# Patient Record
Sex: Male | Born: 1995 | Race: Black or African American | Hispanic: No | Marital: Single | Smoking: Current every day smoker
Health system: Southern US, Community
[De-identification: ages and names within clinical notes are randomized; demographics above are authoritative.]

## PROBLEM LIST (undated history)

## (undated) DIAGNOSIS — Z789 Other specified health status: Secondary | ICD-10-CM

---
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
## 2016-08-23 LAB — HM HIV SCREENING LAB: HM HIV Screening: NEGATIVE

## 2017-10-01 ENCOUNTER — Emergency Department: Payer: Self-pay

## 2017-10-01 ENCOUNTER — Encounter: Payer: Self-pay | Admitting: Emergency Medicine

## 2017-10-01 ENCOUNTER — Other Ambulatory Visit: Payer: Self-pay

## 2017-10-01 ENCOUNTER — Emergency Department
Admission: EM | Admit: 2017-10-01 | Discharge: 2017-10-01 | Disposition: A | Discharge status: Home / Self Care | Payer: Self-pay | Attending: Emergency Medicine | Admitting: Emergency Medicine

## 2017-10-01 DIAGNOSIS — N200 Calculus of kidney: Secondary | ICD-10-CM | POA: Insufficient documentation

## 2017-10-01 DIAGNOSIS — F17200 Nicotine dependence, unspecified, uncomplicated: Secondary | ICD-10-CM | POA: Insufficient documentation

## 2017-10-01 DIAGNOSIS — R310 Gross hematuria: Secondary | ICD-10-CM

## 2017-10-01 LAB — COMPREHENSIVE METABOLIC PANEL
ALT: 18 U/L (ref 0–44)
AST: 22 U/L (ref 15–41)
Albumin: 4.6 g/dL (ref 3.5–5.0)
Alkaline Phosphatase: 43 U/L (ref 38–126)
Anion gap: 5 (ref 5–15)
BILIRUBIN TOTAL: 1 mg/dL (ref 0.3–1.2)
BUN: 10 mg/dL (ref 6–20)
CHLORIDE: 104 mmol/L (ref 98–111)
CO2: 31 mmol/L (ref 22–32)
CREATININE: 1.12 mg/dL (ref 0.61–1.24)
Calcium: 9.2 mg/dL (ref 8.9–10.3)
GFR calc Af Amer: >60 mL/min (ref >60)
Glucose, Bld: 88 mg/dL (ref 70–99)
Potassium: 4 mmol/L (ref 3.5–5.1)
Sodium: 140 mmol/L (ref 135–145)
Total Protein: 7.7 g/dL (ref 6.5–8.1)

## 2017-10-01 LAB — CBC WITH DIFFERENTIAL/PLATELET
Basophils Absolute: 0 10*3/uL (ref 0–0.1)
Basophils Relative: 1 %
Eosinophils Absolute: 0.1 10*3/uL (ref 0–0.7)
Eosinophils Relative: 1 %
HEMATOCRIT: 41.3 % (ref 40.0–52.0)
Hemoglobin: 14.3 g/dL (ref 13.0–18.0)
LYMPHS PCT: 45 %
Lymphs Abs: 2.7 10*3/uL (ref 1.0–3.6)
MCH: 30.9 pg (ref 26.0–34.0)
MCHC: 34.7 g/dL (ref 32.0–36.0)
MCV: 88.9 fL (ref 80.0–100.0)
Monocytes Absolute: 0.3 10*3/uL (ref 0.2–1.0)
Monocytes Relative: 5 %
NEUTROS ABS: 2.9 10*3/uL (ref 1.4–6.5)
Neutrophils Relative %: 48 %
Platelets: 185 10*3/uL (ref 150–440)
RBC: 4.65 MIL/uL (ref 4.40–5.90)
RDW: 13 % (ref 11.5–14.5)
WBC: 6 10*3/uL (ref 3.8–10.6)

## 2017-10-01 LAB — URINALYSIS, COMPLETE (UACMP) WITH MICROSCOPIC
Bacteria, UA: NONE SEEN
Bilirubin Urine: NEGATIVE
Glucose, UA: NEGATIVE mg/dL
Ketones, ur: NEGATIVE mg/dL
Leukocytes, UA: NEGATIVE
Nitrite: NEGATIVE
PROTEIN: 100 mg/dL — AB
RBC / HPF: >50 RBC/hpf — ABNORMAL HIGH (ref 0–5)
SQUAMOUS EPITHELIAL / LPF: NONE SEEN (ref 0–5)
Specific Gravity, Urine: 1.028 (ref 1.005–1.030)
pH: 6 (ref 5.0–8.0)

## 2017-10-01 LAB — CHLAMYDIA/NGC RT PCR (ARMC ONLY)
CHLAMYDIA TR: NOT DETECTED
N GONORRHOEAE: NOT DETECTED

## 2017-10-01 NOTE — ED Provider Notes (Signed)
[facility] Emergency Department Provider Note  ____________________________________________   First MD Initiated Contact with Patient 10/01/17 [phone]     (approximate)  I have reviewed the triage vital signs and the nursing notes.   HISTORY  Chief Complaint Hematuria   HPI Bruce Benson is a 22 y.o. male who self presents to the emergency department with 3 days of gross hematuria.  He does report some mild low back pain although nothing "too crazy".  He has no dysuria.  He says he has had difficulty initiating urination however he has been able to urinate.  No trauma.  No testicular pain.  No penile pain.  No discharge.  His symptoms came on suddenly are intermittent are moderate severity are worse whenever he urinates and improved when not.    History reviewed. No pertinent past medical history.  There are no active problems to display for this patient.   History reviewed. No pertinent surgical history.  Prior to Admission medications   Not on File    Allergies Patient has no known allergies.  No family history on file.  Social History Social History   Tobacco Use  . Smoking status: Current Every Day Smoker  . Smokeless tobacco: Never Used  Substance Use Topics  . Alcohol use: Not on file  . Drug use: Not on file    Review of Systems Constitutional: No fever/chills Eyes: No visual changes. ENT: No sore throat. Cardiovascular: Denies chest pain. Respiratory: Denies shortness of breath. Gastrointestinal: No abdominal pain.  No nausea, no vomiting.  No diarrhea.  No constipation. Genitourinary: Positive for hematuria Musculoskeletal: Positive for back pain. Skin: Negative for rash. Neurological: Negative for headaches, focal weakness or numbness.   ____________________________________________   PHYSICAL EXAM:  VITAL SIGNS: ED Triage Vitals  Enc Vitals Group     BP 10/01/17 0357 121/71     Pulse Rate 10/01/17 0357 (!) 47    Resp 10/01/17 0357 18     Temp 10/01/17 0357 97.9 F (36.6 C)     Temp Source 10/01/17 0357 Oral     SpO2 10/01/17 0357 99 %     Weight 10/01/17 0352 161 lb (73 kg)     Height 10/01/17 0352 6\' 3"  (1.905 m)     Head Circumference --      Peak Flow --      Pain Score 10/01/17 0352 0     Pain Loc --      Pain Edu? --      Excl. in GC? --     Constitutional: Alert and oriented x4 pleasant cooperative speaks in full clear sentences no diaphoresis Eyes: PERRL EOMI. Head: Atraumatic. Nose: No congestion/rhinnorhea. Mouth/Throat: No trismus Neck: No stridor.   Cardiovascular: Normal rate, regular rhythm. Grossly normal heart sounds.  Good peripheral circulation. Respiratory: Normal respiratory effort.  No retractions. Lungs CTAB and moving good air Gastrointestinal: Soft nontender Circumcised phallus normal testes normal lie no discharge Musculoskeletal: No lower extremity edema   Neurologic:  Normal speech and language. No gross focal neurologic deficits are appreciated. Skin:  Skin is warm, dry and intact. No rash noted. Psychiatric: Mood and affect are normal. Speech and behavior are normal.    ____________________________________________   DIFFERENTIAL includes but not limited to  Kidney stone, testicular hematoma, sexually transmitted infection, bladder cancer ____________________________________________   LABS (all labs ordered are listed, but only abnormal results are displayed)  Labs Reviewed  URINALYSIS, COMPLETE (UACMP) WITH MICROSCOPIC - Abnormal; Notable for the  following components:      Result Value   Color, Urine YELLOW (*)    APPearance CLOUDY (*)    Hgb urine dipstick LARGE (*)    Protein, ur 100 (*)    RBC / HPF >50 (*)    All other components within normal limits  CHLAMYDIA/NGC RT PCR ([facility] ONLY)  CBC WITH DIFFERENTIAL/PLATELET  COMPREHENSIVE METABOLIC PANEL    Lab work reviewed by me with gross hematuria otherwise  unremarkable __________________________________________  EKG   ____________________________________________  RADIOLOGY  CT stone reviewed by me consistent with recently passed stone ____________________________________________   PROCEDURES  Procedure(s) performed: no  Procedures  Critical Care performed: no  ____________________________________________   INITIAL IMPRESSION / ASSESSMENT AND PLAN / ED COURSE  Pertinent labs & imaging results that were available during my care of the patient were reviewed by me and considered in my medical decision making (see chart for details).   As part of my medical decision making, I reviewed the following data within the electronic MEDICAL RECORD NUMBER History obtained from family if available, nursing notes, old chart and ekg, as well as notes from prior ED visits.  The patient arrives hemodynamically stable and relatively well-appearing with new gross hematuria.  He did have some mild back pain and this could represent a new onset kidney stone.  CT stone pending.  No infectious symptoms.  The patient CT is consistent with a stone recently passed into his bladder.  At this point the patient is given reassurance and medically stable for outpatient management.      ____________________________________________   FINAL CLINICAL IMPRESSION(S) / ED DIAGNOSES  Final diagnoses:  Gross hematuria  Kidney stone      NEW MEDICATIONS STARTED DURING THIS VISIT:  There are no discharge medications for this patient.    Note:  This document was prepared using Dragon voice recognition software and may include unintentional dictation errors.     Merrily Brittleifenbark, Ihsan Nomura, MD 10/01/17 [phone]

## 2017-10-01 NOTE — ED Triage Notes (Addendum)
Patient ambulatory to triage with steady gait, without difficulty or distress noted; pt reports hematuria x 3 day but denies any c/o pain

## 2017-10-01 NOTE — ED Notes (Signed)
Patient reports he has been urinating blood for the past 3 days.  Patient also reports that when he urinates its a few drops at a time.  Denies pain with urination.   Reports occasional suprapubic pain.

## 2017-10-01 NOTE — ED Notes (Signed)
Returned from CT.

## 2017-10-01 NOTE — Discharge Instructions (Signed)
It was a pleasure to take care of you today, and thank you for coming to our emergency department.  If you have any questions or concerns before leaving please ask the nurse to grab me and I'm more than happy to go through your aftercare instructions again.  If you were prescribed any opioid pain medication today such as Norco, Vicodin, Percocet, morphine, hydrocodone, or oxycodone please make sure you do not drive when you are taking this medication as it can alter your ability to drive safely.  If you have any concerns once you are home that you are not improving or are in fact getting worse before you can make it to your follow-up appointment, please do not hesitate to call 911 and come back for further evaluation.  Merrily BrittleNeil Layni Kreamer, MD  Results for orders placed or performed during the hospital encounter of 10/01/17  Chlamydia/NGC rt PCR ([facility] only)  Result Value Ref Range   Specimen source GC/Chlam URINE, RANDOM    Chlamydia Tr NOT DETECTED NOT DETECTED   N gonorrhoeae NOT DETECTED NOT DETECTED  CBC with Differential  Result Value Ref Range   WBC 6.0 3.8 - 10.6 K/uL   RBC 4.65 4.40 - 5.90 MIL/uL   Hemoglobin 14.3 13.0 - 18.0 g/dL   HCT 16.141.3 09.640.0 - 04.552.0 %   MCV 88.9 80.0 - 100.0 fL   MCH 30.9 26.0 - 34.0 pg   MCHC 34.7 32.0 - 36.0 g/dL   RDW 40.913.0 81.111.5 - 91.414.5 %   Platelets 185 150 - 440 K/uL   Neutrophils Relative % 48 %   Neutro Abs 2.9 1.4 - 6.5 K/uL   Lymphocytes Relative 45 %   Lymphs Abs 2.7 1.0 - 3.6 K/uL   Monocytes Relative 5 %   Monocytes Absolute 0.3 0.2 - 1.0 K/uL   Eosinophils Relative 1 %   Eosinophils Absolute 0.1 0 - 0.7 K/uL   Basophils Relative 1 %   Basophils Absolute 0.0 0 - 0.1 K/uL  Comprehensive metabolic panel  Result Value Ref Range   Sodium 140 135 - 145 mmol/L   Potassium 4.0 3.5 - 5.1 mmol/L   Chloride 104 98 - 111 mmol/L   CO2 31 22 - 32 mmol/L   Glucose, Bld 88 70 - 99 mg/dL   BUN 10 6 - 20 mg/dL   Creatinine, Ser 7.821.12 0.61 - 1.24 mg/dL   Calcium 9.2 8.9 - 95.610.3 mg/dL   Total Protein 7.7 6.5 - 8.1 g/dL   Albumin 4.6 3.5 - 5.0 g/dL   AST 22 15 - 41 U/L   ALT 18 0 - 44 U/L   Alkaline Phosphatase 43 38 - 126 U/L   Total Bilirubin 1.0 0.3 - 1.2 mg/dL   GFR calc non Af Amer >60 >60 mL/min   GFR calc Af Amer >60 >60 mL/min   Anion gap 5 5 - 15  Urinalysis, Complete w Microscopic  Result Value Ref Range   Color, Urine YELLOW (A) YELLOW   APPearance CLOUDY (A) CLEAR   Specific Gravity, Urine 1.028 1.005 - 1.030   pH 6.0 5.0 - 8.0   Glucose, UA NEGATIVE NEGATIVE mg/dL   Hgb urine dipstick LARGE (A) NEGATIVE   Bilirubin Urine NEGATIVE NEGATIVE   Ketones, ur NEGATIVE NEGATIVE mg/dL   Protein, ur 213100 (A) NEGATIVE mg/dL   Nitrite NEGATIVE NEGATIVE   Leukocytes, UA NEGATIVE NEGATIVE   RBC / HPF >50 (H) 0 - 5 RBC/hpf   WBC, UA 0-5 0 - 5 WBC/hpf  Bacteria, UA NONE SEEN NONE SEEN   Squamous Epithelial / LPF NONE SEEN 0 - 5   Mucus PRESENT    Ct Renal Stone Study  Result Date: 10/01/2017 CLINICAL DATA:  Initial evaluation for hematuria, unknown cause. EXAM: CT ABDOMEN AND PELVIS WITHOUT CONTRAST TECHNIQUE: Multidetector CT imaging of the abdomen and pelvis was performed following the standard protocol without IV contrast. COMPARISON:  None. FINDINGS: Lower chest: Visualized lung bases are clear. Hepatobiliary: Limited noncontrast evaluation of the liver is unremarkable. Gallbladder within normal limits. No biliary dilatation. Pancreas: Pancreas grossly within normal limits. Spleen: Spleen within normal limits. Adrenals/Urinary Tract: Adrenal glands are normal. Kidneys equal in size. Few punctate nonobstructive calculi noted within the kidneys bilaterally, more evident on the right. No discernible radiopaque calculi seen along the course of either ureter. No hydronephrosis or hydroureter. 3 mm stone seen layering within the right posterior bladder lumen, likely a recently passed stone (series 2, image 80). The bladder is otherwise  decompressed without abnormality. Stomach/Bowel: Stomach within normal limits. No evidence for bowel obstruction. Normal appendix. No acute inflammatory changes about the bowels. Vascular/Lymphatic: Intra-abdominal aorta of normal caliber. No appreciable adenopathy, although evaluation limited given lack of IV contrast and paucity of intra-abdominal fat. Reproductive: Prostate within normal limits. Other: No free air or fluid. Musculoskeletal: No acute osseous abnormality. No worrisome lytic or blastic osseous lesions. IMPRESSION: 1. 3 mm stone layering within the right posterior bladder lumen, consistent with a recently passed stone. No significant hydroureteronephrosis. 2. Additional punctate bilateral nonobstructive nephrolithiasis. 3. No other acute abnormality within the abdomen and pelvis. Electronically Signed   By: Rise MuBenjamin  McClintock M.D.   On: 10/01/2017 05:39

## 2017-10-01 NOTE — ED Notes (Signed)
To CT via stretcher

## 2017-11-17 ENCOUNTER — Other Ambulatory Visit: Payer: Self-pay

## 2017-11-17 ENCOUNTER — Emergency Department (HOSPITAL_COMMUNITY): Payer: Self-pay

## 2017-11-17 ENCOUNTER — Inpatient Hospital Stay (HOSPITAL_COMMUNITY)
Admission: EM | Admit: 2017-11-17 | Discharge: 2017-11-21 | DRG: 492 | Disposition: A | Discharge status: Home / Self Care | Payer: Self-pay | Attending: Student | Admitting: Student

## 2017-11-17 ENCOUNTER — Emergency Department (HOSPITAL_COMMUNITY): Payer: Self-pay | Admitting: Anesthesiology

## 2017-11-17 ENCOUNTER — Encounter (HOSPITAL_COMMUNITY)
Admission: EM | Disposition: A | Discharge status: Home / Self Care | Payer: Self-pay | Source: Home / Self Care | Attending: Student

## 2017-11-17 ENCOUNTER — Encounter (HOSPITAL_COMMUNITY): Payer: Self-pay | Admitting: Oncology

## 2017-11-17 DIAGNOSIS — T148XXA Other injury of unspecified body region, initial encounter: Secondary | ICD-10-CM

## 2017-11-17 DIAGNOSIS — F419 Anxiety disorder, unspecified: Secondary | ICD-10-CM | POA: Diagnosis present

## 2017-11-17 DIAGNOSIS — S82851B Displaced trimalleolar fracture of right lower leg, initial encounter for open fracture type I or II: Secondary | ICD-10-CM

## 2017-11-17 DIAGNOSIS — S82871B Displaced pilon fracture of right tibia, initial encounter for open fracture type I or II: Principal | ICD-10-CM | POA: Diagnosis present

## 2017-11-17 DIAGNOSIS — S82899A Other fracture of unspecified lower leg, initial encounter for closed fracture: Secondary | ICD-10-CM

## 2017-11-17 DIAGNOSIS — Z419 Encounter for procedure for purposes other than remedying health state, unspecified: Secondary | ICD-10-CM

## 2017-11-17 DIAGNOSIS — S82831B Other fracture of upper and lower end of right fibula, initial encounter for open fracture type I or II: Secondary | ICD-10-CM | POA: Diagnosis present

## 2017-11-17 DIAGNOSIS — F17200 Nicotine dependence, unspecified, uncomplicated: Secondary | ICD-10-CM | POA: Diagnosis present

## 2017-11-17 HISTORY — PX: I & D EXTREMITY: SHX5045

## 2017-11-17 HISTORY — PX: INCISION AND DRAINAGE: SHX5863

## 2017-11-17 HISTORY — PX: EXTERNAL FIXATION LEG: SHX1549

## 2017-11-17 SURGERY — IRRIGATION AND DEBRIDEMENT EXTREMITY
Anesthesia: General | Site: Ankle | Laterality: Right

## 2017-11-17 MED ORDER — LIDOCAINE 2% (20 MG/ML) 5 ML SYRINGE
INTRAMUSCULAR | Status: AC
  Filled 2017-11-17: qty 5

## 2017-11-17 MED ORDER — SUGAMMADEX SODIUM 200 MG/2ML IV SOLN
INTRAVENOUS | Status: DC | PRN
  Administered 2017-11-17: 150 mg via INTRAVENOUS

## 2017-11-17 MED ORDER — DEXAMETHASONE SODIUM PHOSPHATE 10 MG/ML IJ SOLN
INTRAMUSCULAR | Status: DC | PRN
  Administered 2017-11-17: 4 mg via INTRAVENOUS

## 2017-11-17 MED ORDER — SUCCINYLCHOLINE CHLORIDE 200 MG/10ML IV SOSY
PREFILLED_SYRINGE | INTRAVENOUS | Status: AC
  Filled 2017-11-17: qty 10

## 2017-11-17 MED ORDER — HYDROMORPHONE HCL 1 MG/ML IJ SOLN
INTRAMUSCULAR | Status: AC
  Administered 2017-11-17: 0.5 mg via INTRAVENOUS
  Filled 2017-11-17: qty 1

## 2017-11-17 MED ORDER — KETOROLAC TROMETHAMINE 15 MG/ML IJ SOLN
INTRAMUSCULAR | Status: AC
  Filled 2017-11-17: qty 1

## 2017-11-17 MED ORDER — ROCURONIUM BROMIDE 50 MG/5ML IV SOSY
PREFILLED_SYRINGE | INTRAVENOUS | Status: DC | PRN
  Administered 2017-11-17: 50 mg via INTRAVENOUS

## 2017-11-17 MED ORDER — CEFAZOLIN SODIUM-DEXTROSE 1-4 GM/50ML-% IV SOLN
1.0000 g | Freq: Once | INTRAVENOUS | Status: AC
  Administered 2017-11-17: 1 g via INTRAVENOUS
  Filled 2017-11-17: qty 50

## 2017-11-17 MED ORDER — OXYCODONE HCL 5 MG/5ML PO SOLN: 5.0000 mg | Freq: Once | ORAL | Status: DC | PRN

## 2017-11-17 MED ORDER — ONDANSETRON HCL 4 MG/2ML IJ SOLN
INTRAMUSCULAR | Status: DC | PRN
  Administered 2017-11-17: 4 mg via INTRAVENOUS

## 2017-11-17 MED ORDER — PROPOFOL 10 MG/ML IV BOLUS
INTRAVENOUS | Status: AC
  Filled 2017-11-17: qty 20

## 2017-11-17 MED ORDER — SUCCINYLCHOLINE CHLORIDE 20 MG/ML IJ SOLN
INTRAMUSCULAR | Status: DC | PRN
  Administered 2017-11-17: 50 mg via INTRAVENOUS

## 2017-11-17 MED ORDER — HYDROMORPHONE HCL 1 MG/ML IJ SOLN
0.2500 mg | INTRAMUSCULAR | Status: DC | PRN
  Administered 2017-11-17: 0.25 mg via INTRAVENOUS
  Administered 2017-11-17 (×2): 0.5 mg via INTRAVENOUS
  Administered 2017-11-17: 0.25 mg via INTRAVENOUS

## 2017-11-17 MED ORDER — KETOROLAC TROMETHAMINE 15 MG/ML IJ SOLN
15.0000 mg | Freq: Four times a day (QID) | INTRAMUSCULAR | Status: DC
  Administered 2017-11-17 – 2017-11-21 (×13): 15 mg via INTRAVENOUS
  Filled 2017-11-17 (×14): qty 1

## 2017-11-17 MED ORDER — FENTANYL CITRATE (PF) 100 MCG/2ML IJ SOLN
INTRAMUSCULAR | Status: DC | PRN
  Administered 2017-11-17 (×2): 50 ug via INTRAVENOUS

## 2017-11-17 MED ORDER — LACTATED RINGERS IV SOLN
INTRAVENOUS | Status: DC | PRN
  Administered 2017-11-17 (×2): via INTRAVENOUS

## 2017-11-17 MED ORDER — MIDAZOLAM HCL 5 MG/5ML IJ SOLN
INTRAMUSCULAR | Status: DC | PRN
  Administered 2017-11-17: 1 mg via INTRAVENOUS

## 2017-11-17 MED ORDER — PROPOFOL 10 MG/ML IV BOLUS
INTRAVENOUS | Status: DC | PRN
  Administered 2017-11-17: 70 mg via INTRAVENOUS
  Administered 2017-11-17: 130 mg via INTRAVENOUS
  Administered 2017-11-17: 50 mg via INTRAVENOUS

## 2017-11-17 MED ORDER — CEFAZOLIN SODIUM-DEXTROSE 1-4 GM/50ML-% IV SOLN
INTRAVENOUS | Status: DC | PRN
  Administered 2017-11-17: 1 g via INTRAVENOUS

## 2017-11-17 MED ORDER — ACETAMINOPHEN 10 MG/ML IV SOLN
1000.0000 mg | Freq: Once | INTRAVENOUS | Status: DC | PRN
  Administered 2017-11-17: 1000 mg via INTRAVENOUS

## 2017-11-17 MED ORDER — ROCURONIUM BROMIDE 50 MG/5ML IV SOSY
PREFILLED_SYRINGE | INTRAVENOUS | Status: AC
  Filled 2017-11-17: qty 5

## 2017-11-17 MED ORDER — ACETAMINOPHEN 500 MG PO TABS: 1000.0000 mg | ORAL_TABLET | Freq: Once | ORAL | Status: DC | PRN

## 2017-11-17 MED ORDER — PHENYLEPHRINE 40 MCG/ML (10ML) SYRINGE FOR IV PUSH (FOR BLOOD PRESSURE SUPPORT)
PREFILLED_SYRINGE | INTRAVENOUS | Status: AC
  Filled 2017-11-17: qty 10

## 2017-11-17 MED ORDER — SODIUM CHLORIDE 0.9 % IR SOLN
Status: DC | PRN
  Administered 2017-11-17 (×2): 3000 mL

## 2017-11-17 MED ORDER — PHENYLEPHRINE 40 MCG/ML (10ML) SYRINGE FOR IV PUSH (FOR BLOOD PRESSURE SUPPORT)
PREFILLED_SYRINGE | INTRAVENOUS | Status: DC | PRN
  Administered 2017-11-17 (×2): 40 ug via INTRAVENOUS

## 2017-11-17 MED ORDER — ACETAMINOPHEN 10 MG/ML IV SOLN
INTRAVENOUS | Status: AC
  Administered 2017-11-17: 1000 mg via INTRAVENOUS
  Filled 2017-11-17: qty 100

## 2017-11-17 MED ORDER — ONDANSETRON HCL 4 MG/2ML IJ SOLN
INTRAMUSCULAR | Status: AC
  Filled 2017-11-17: qty 2

## 2017-11-17 MED ORDER — DEXAMETHASONE SODIUM PHOSPHATE 10 MG/ML IJ SOLN
INTRAMUSCULAR | Status: AC
  Filled 2017-11-17: qty 1

## 2017-11-17 MED ORDER — HYDROMORPHONE HCL 1 MG/ML IJ SOLN
1.0000 mg | Freq: Once | INTRAMUSCULAR | Status: AC
  Administered 2017-11-17: 1 mg via INTRAVENOUS
  Filled 2017-11-17: qty 1

## 2017-11-17 MED ORDER — MIDAZOLAM HCL 2 MG/2ML IJ SOLN
INTRAMUSCULAR | Status: AC
  Filled 2017-11-17: qty 2

## 2017-11-17 MED ORDER — KETAMINE HCL 50 MG/5ML IJ SOSY: 1.0000 mg/kg | PREFILLED_SYRINGE | Freq: Once | INTRAMUSCULAR | Status: DC

## 2017-11-17 MED ORDER — OXYCODONE HCL 5 MG PO TABS: 5.0000 mg | ORAL_TABLET | Freq: Once | ORAL | Status: DC | PRN

## 2017-11-17 MED ORDER — LIDOCAINE HCL (CARDIAC) PF 100 MG/5ML IV SOSY
PREFILLED_SYRINGE | INTRAVENOUS | Status: DC | PRN
  Administered 2017-11-17: 40 mg via INTRAVENOUS

## 2017-11-17 MED ORDER — ARTIFICIAL TEARS OPHTHALMIC OINT
TOPICAL_OINTMENT | OPHTHALMIC | Status: AC
  Filled 2017-11-17: qty 3.5

## 2017-11-17 MED ORDER — HYDROMORPHONE HCL 1 MG/ML IJ SOLN
INTRAMUSCULAR | Status: AC
  Administered 2017-11-17: 1 mg
  Filled 2017-11-17: qty 1

## 2017-11-17 MED ORDER — ACETAMINOPHEN 160 MG/5ML PO SOLN: 1000.0000 mg | Freq: Once | ORAL | Status: DC | PRN

## 2017-11-17 MED ORDER — FENTANYL CITRATE (PF) 250 MCG/5ML IJ SOLN
INTRAMUSCULAR | Status: AC
  Filled 2017-11-17: qty 5

## 2017-11-17 SURGICAL SUPPLY — 83 items
ALCOHOL 70% 16 OZ (MISCELLANEOUS) ×3 IMPLANT
BANDAGE ACE 3X5.8 VEL STRL LF (GAUZE/BANDAGES/DRESSINGS) IMPLANT
BANDAGE ACE 4X5 VEL STRL LF (GAUZE/BANDAGES/DRESSINGS) ×3 IMPLANT
BANDAGE ACE 6X5 VEL STRL LF (GAUZE/BANDAGES/DRESSINGS) ×3 IMPLANT
BLADE SURG 10 STRL SS (BLADE) IMPLANT
BNDG COHESIVE 1X5 TAN STRL LF (GAUZE/BANDAGES/DRESSINGS) IMPLANT
BNDG COHESIVE 4X5 TAN STRL (GAUZE/BANDAGES/DRESSINGS) IMPLANT
BNDG COHESIVE 6X5 TAN STRL LF (GAUZE/BANDAGES/DRESSINGS) ×3 IMPLANT
BNDG CONFORM 3 STRL LF (GAUZE/BANDAGES/DRESSINGS) IMPLANT
BNDG GAUZE ELAST 4 BULKY (GAUZE/BANDAGES/DRESSINGS) ×3 IMPLANT
BNDG GAUZE STRTCH 6 (GAUZE/BANDAGES/DRESSINGS) IMPLANT
CLAMP LG COMBINATION (Clamp) ×6 IMPLANT
CLAMP LG MULTI PIN (Clamp) ×3 IMPLANT
CLAMP ROD ATTACHMENT (Clamp) ×3 IMPLANT
CORDS BIPOLAR (ELECTRODE) IMPLANT
COVER SURGICAL LIGHT HANDLE (MISCELLANEOUS) ×3 IMPLANT
COVER WAND RF STERILE (DRAPES) ×3 IMPLANT
CUFF TOURNIQUET SINGLE 24IN (TOURNIQUET CUFF) IMPLANT
CUFF TOURNIQUET SINGLE 34IN LL (TOURNIQUET CUFF) ×3 IMPLANT
CUFF TOURNIQUET SINGLE 44IN (TOURNIQUET CUFF) IMPLANT
DRAPE C-ARM 42X72 X-RAY (DRAPES) IMPLANT
DRAPE C-ARMOR (DRAPES) ×3 IMPLANT
DRAPE EXTREMITY BILATERAL (DRAPES) IMPLANT
DRAPE IMP U-DRAPE 54X76 (DRAPES) ×3 IMPLANT
DRAPE INCISE IOBAN 66X45 STRL (DRAPES) ×3 IMPLANT
DRAPE SURG 17X23 STRL (DRAPES) IMPLANT
DRAPE U-SHAPE 47X51 STRL (DRAPES) ×3 IMPLANT
DURAPREP 26ML APPLICATOR (WOUND CARE) ×3 IMPLANT
ELECT CAUTERY BLADE 6.4 (BLADE) IMPLANT
ELECT REM PT RETURN 9FT ADLT (ELECTROSURGICAL) ×3
ELECTRODE REM PT RTRN 9FT ADLT (ELECTROSURGICAL) ×1 IMPLANT
FACESHIELD WRAPAROUND (MASK) IMPLANT
GAUZE SPONGE 4X4 12PLY STRL (GAUZE/BANDAGES/DRESSINGS) IMPLANT
GAUZE XEROFORM 1X8 LF (GAUZE/BANDAGES/DRESSINGS) IMPLANT
GAUZE XEROFORM 5X9 LF (GAUZE/BANDAGES/DRESSINGS) ×3 IMPLANT
GLOVE BIO SURGEON STRL SZ7.5 (GLOVE) ×3 IMPLANT
GLOVE BIOGEL PI IND STRL 7.0 (GLOVE) ×1 IMPLANT
GLOVE BIOGEL PI IND STRL 8 (GLOVE) ×1 IMPLANT
GLOVE BIOGEL PI INDICATOR 7.0 (GLOVE) ×2
GLOVE BIOGEL PI INDICATOR 8 (GLOVE) ×2
GLOVE SURG SS PI 7.0 STRL IVOR (GLOVE) ×3 IMPLANT
GLOVE SURG SS PI 7.5 STRL IVOR (GLOVE) ×3 IMPLANT
GOWN STRL REUS W/ TWL LRG LVL3 (GOWN DISPOSABLE) ×2 IMPLANT
GOWN STRL REUS W/ TWL XL LVL3 (GOWN DISPOSABLE) ×1 IMPLANT
GOWN STRL REUS W/TWL LRG LVL3 (GOWN DISPOSABLE) ×4
GOWN STRL REUS W/TWL XL LVL3 (GOWN DISPOSABLE) ×2
HANDPIECE INTERPULSE COAX TIP (DISPOSABLE)
KIT BASIN OR (CUSTOM PROCEDURE TRAY) ×3 IMPLANT
KIT TURNOVER KIT B (KITS) ×3 IMPLANT
MANIFOLD NEPTUNE II (INSTRUMENTS) ×3 IMPLANT
NEEDLE 22X1 1/2 (OR ONLY) (NEEDLE) IMPLANT
NS IRRIG 1000ML POUR BTL (IV SOLUTION) ×6 IMPLANT
PACK ORTHO EXTREMITY (CUSTOM PROCEDURE TRAY) ×3 IMPLANT
PAD ABD 8X10 STRL (GAUZE/BANDAGES/DRESSINGS) ×9 IMPLANT
PAD ARMBOARD 7.5X6 YLW CONV (MISCELLANEOUS) ×3 IMPLANT
PADDING CAST ABS 4INX4YD NS (CAST SUPPLIES) ×4
PADDING CAST ABS COTTON 4X4 ST (CAST SUPPLIES) ×2 IMPLANT
PADDING CAST COTTON 6X4 STRL (CAST SUPPLIES) ×9 IMPLANT
PIN SHANZ TRANSFIX 6.0X225 (PIN) ×3 IMPLANT
ROD CRBN FBR LRG EX-FX 11X350 (Rod) ×6 IMPLANT
SCREW SHANZ 5.0X175MM (Screw) ×6 IMPLANT
SET HNDPC FAN SPRY TIP SCT (DISPOSABLE) IMPLANT
SET IRRIG Y TYPE TUR BLADDER L (SET/KITS/TRAYS/PACK) ×3 IMPLANT
SPONGE LAP 18X18 X RAY DECT (DISPOSABLE) ×6 IMPLANT
STAPLER VISISTAT 35W (STAPLE) IMPLANT
STOCKINETTE IMPERVIOUS 9X36 MD (GAUZE/BANDAGES/DRESSINGS) ×3 IMPLANT
STOCKINETTE IMPERVIOUS LG (DRAPES) ×3 IMPLANT
SUT ETHILON 2 0 FS 18 (SUTURE) ×3 IMPLANT
SUT ETHILON 2 0 PSLX (SUTURE) IMPLANT
SUT ETHILON 3 0 PS 1 (SUTURE) IMPLANT
SUT VIC AB 2-0 CT1 36 (SUTURE) IMPLANT
SUT VIC AB 2-0 FS1 27 (SUTURE) ×3 IMPLANT
SWAB CULTURE ESWAB REG 1ML (MISCELLANEOUS) IMPLANT
SYR CONTROL 10ML LL (SYRINGE) IMPLANT
TOWEL OR 17X24 6PK STRL BLUE (TOWEL DISPOSABLE) ×6 IMPLANT
TOWEL OR 17X26 10 PK STRL BLUE (TOWEL DISPOSABLE) ×6 IMPLANT
TUBE CONNECTING 12'X1/4 (SUCTIONS) ×1
TUBE CONNECTING 12X1/4 (SUCTIONS) ×2 IMPLANT
TUBE FEEDING ENTERAL 5FR 16IN (TUBING) IMPLANT
TUBING CYSTO DISP (UROLOGICAL SUPPLIES) ×3 IMPLANT
UNDERPAD 30X30 (UNDERPADS AND DIAPERS) ×6 IMPLANT
WATER STERILE IRR 1000ML POUR (IV SOLUTION) ×6 IMPLANT
YANKAUER SUCT BULB TIP NO VENT (SUCTIONS) ×3 IMPLANT
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
## 2017-11-17 NOTE — Brief Op Note (Signed)
11/17/2017  10:33 PM  PATIENT:  Bruce Benson  22 y.o. male  PRE-OPERATIVE DIAGNOSIS:  RIGHT PILON ANKLE OPEN FRACTURE  POST-OPERATIVE DIAGNOSIS:  RIGHT PILON ANKLE OPEN FRACTURE  PROCEDURE:  Procedure(s): IRRIGATION AND DEBRIDEMENT EXTREMITY (Right) EXTERNAL FIXATION, RIGHT LOWER LEG (Right)  SURGEON:  Surgeon(s) and Role:    * Yolonda Kida, MD - Primary  PHYSICIAN ASSISTANT:   ASSISTANTS: none   ANESTHESIA:   general  EBL:   10 cc  BLOOD ADMINISTERED:none  DRAINS: none   LOCAL MEDICATIONS USED:  NONE  SPECIMEN:  No Specimen  DISPOSITION OF SPECIMEN:  N/A  COUNTS:  YES  TOURNIQUET:  * No tourniquets in log *  DICTATION: .Note written in EPIC  PLAN OF CARE: Admit to inpatient   PATIENT DISPOSITION:  PACU - hemodynamically stable.   Delay start of Pharmacological VTE agent (>24hrs) due to surgical blood loss or risk of bleeding: not applicable
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
## 2017-11-17 NOTE — Transfer of Care (Signed)
Immediate Anesthesia Transfer of Care Note  Patient: Bruce Benson  Procedure(s) Performed: IRRIGATION AND DEBRIDEMENT EXTREMITY (Right Ankle) EXTERNAL FIXATION, RIGHT LOWER LEG (Right Ankle)  Patient Location: PACU  Anesthesia Type:General  Level of Consciousness: awake  Airway & Oxygen Therapy: Patient Spontanous Breathing and Patient connected to nasal cannula oxygen  Post-op Assessment: Report given to RN and Post -op Vital signs reviewed and stable  Post vital signs: Reviewed and stable  Last Vitals:  Vitals Value Taken Time  BP 116/65 11/17/2017 10:45 PM  Temp    Pulse 78 11/17/2017 10:46 PM  Resp 23 11/17/2017 10:46 PM  SpO2 100 % 11/17/2017 10:46 PM  Vitals shown include unvalidated device data.  Last Pain:  Vitals:   11/17/17 1924  TempSrc:   PainSc: 10-Worst pain ever      Patients Stated Pain Goal: 6 (11/17/17 1924)  Complications: No apparent anesthesia complications

## 2017-11-17 NOTE — ED Notes (Signed)
Patient transported to X-ray 

## 2017-11-17 NOTE — Anesthesia Procedure Notes (Signed)
Procedure Name: Intubation Date/Time: 11/17/2017 9:17 PM Performed by: Edmonia Caprio, CRNA Pre-anesthesia Checklist: Patient identified, Suction available, Timeout performed, Patient being monitored and Emergency Drugs available Patient Re-evaluated:Patient Re-evaluated prior to induction Oxygen Delivery Method: Circle system utilized Preoxygenation: Pre-oxygenation with 100% oxygen Induction Type: IV induction and Rapid sequence Laryngoscope Size: Miller and 1 Grade View: Grade I Tube type: Oral Tube size: 7.5 mm Number of attempts: 1 Airway Equipment and Method: Stylet Placement Confirmation: ETT inserted through vocal cords under direct vision,  positive ETCO2 and breath sounds checked- equal and bilateral Secured at: 23 cm Dental Injury: Teeth and Oropharynx as per pre-operative assessment  Comments: TMJ dislocated with mouth opening prior to intubation. Return to normal position.

## 2017-11-17 NOTE — Anesthesia Preprocedure Evaluation (Addendum)
Anesthesia Evaluation  Patient identified by MRN, date of birth, ID band Patient awake    Reviewed: Allergy & Precautions, NPO status , Patient's Chart, lab work & pertinent test results  History of Anesthesia Complications Negative for: history of anesthetic complications  Airway Mallampati: I  TM Distance: >3 FB Neck ROM: Full    Dental  (+) Teeth Intact   Pulmonary Current Smoker,    breath sounds clear to auscultation       Cardiovascular negative cardio ROS   Rhythm:Regular     Neuro/Psych negative neurological ROS  negative psych ROS   GI/Hepatic negative GI ROS, Neg liver ROS,   Endo/Other  negative endocrine ROS  Renal/GU negative Renal ROS     Musculoskeletal Open right ankle fx   Abdominal   Peds  Hematology negative hematology ROS (+)   Anesthesia Other Findings   Reproductive/Obstetrics                            Anesthesia Physical Anesthesia Plan  ASA: II  Anesthesia Plan: General   Post-op Pain Management:    Induction: Intravenous and Rapid sequence  PONV Risk Score and Plan: 1 and Ondansetron and Dexamethasone  Airway Management Planned: Oral ETT  Additional Equipment: None  Intra-op Plan:   Post-operative Plan: Extubation in OR  Informed Consent: I have reviewed the patients History and Physical, chart, labs and discussed the procedure including the risks, benefits and alternatives for the proposed anesthesia with the patient or authorized representative who has indicated his/her understanding and acceptance.   Dental advisory given  Plan Discussed with: CRNA and Surgeon  Anesthesia Plan Comments:         Anesthesia Quick Evaluation

## 2017-11-18 ENCOUNTER — Encounter (HOSPITAL_COMMUNITY): Payer: Self-pay | Admitting: General Practice

## 2017-11-18 ENCOUNTER — Inpatient Hospital Stay (HOSPITAL_COMMUNITY): Payer: Self-pay

## 2017-11-18 LAB — CREATININE, SERUM
CREATININE: 1.2 mg/dL (ref 0.61–1.24)
GFR calc Af Amer: >60 mL/min (ref >60)

## 2017-11-18 LAB — CBC
HEMATOCRIT: 39.7 % (ref 39.0–52.0)
Hemoglobin: 13.2 g/dL (ref 13.0–17.0)
MCH: 29.8 pg (ref 26.0–34.0)
MCHC: 33.2 g/dL (ref 30.0–36.0)
MCV: 89.6 fL (ref 78.0–100.0)
Platelets: 164 10*3/uL (ref 150–400)
RBC: 4.43 MIL/uL (ref 4.22–5.81)
RDW: 12.1 % (ref 11.5–15.5)
WBC: 9.9 10*3/uL (ref 4.0–10.5)

## 2017-11-18 LAB — HIV ANTIBODY (ROUTINE TESTING W REFLEX): HIV SCREEN 4TH GENERATION: NONREACTIVE

## 2017-11-18 MED ORDER — ACETAMINOPHEN 500 MG PO TABS
1000.0000 mg | ORAL_TABLET | Freq: Four times a day (QID) | ORAL | Status: DC
  Administered 2017-11-18 – 2017-11-21 (×12): 1000 mg via ORAL
  Filled 2017-11-18 (×12): qty 2

## 2017-11-18 MED ORDER — DOCUSATE SODIUM 100 MG PO CAPS
100.0000 mg | ORAL_CAPSULE | Freq: Two times a day (BID) | ORAL | Status: DC
  Administered 2017-11-18 – 2017-11-21 (×7): 100 mg via ORAL
  Filled 2017-11-18 (×7): qty 1

## 2017-11-18 MED ORDER — OXYCODONE HCL 5 MG PO TABS
5.0000 mg | ORAL_TABLET | ORAL | Status: DC | PRN
  Administered 2017-11-18 – 2017-11-21 (×12): 10 mg via ORAL
  Filled 2017-11-18 (×12): qty 2

## 2017-11-18 MED ORDER — ENOXAPARIN SODIUM 40 MG/0.4ML ~~LOC~~ SOLN
40.0000 mg | Freq: Every day | SUBCUTANEOUS | Status: DC
  Administered 2017-11-20: 40 mg via SUBCUTANEOUS
  Filled 2017-11-18 (×3): qty 0.4

## 2017-11-18 MED ORDER — METHOCARBAMOL 1000 MG/10ML IJ SOLN
500.0000 mg | Freq: Four times a day (QID) | INTRAVENOUS | Status: DC | PRN
  Filled 2017-11-18: qty 5

## 2017-11-18 MED ORDER — ACETAMINOPHEN 325 MG PO TABS
650.0000 mg | ORAL_TABLET | Freq: Four times a day (QID) | ORAL | Status: DC | PRN
  Filled 2017-11-18 (×2): qty 2

## 2017-11-18 MED ORDER — METHOCARBAMOL 500 MG PO TABS
500.0000 mg | ORAL_TABLET | Freq: Four times a day (QID) | ORAL | Status: DC | PRN
  Administered 2017-11-18 – 2017-11-20 (×4): 500 mg via ORAL
  Filled 2017-11-18 (×4): qty 1

## 2017-11-18 MED ORDER — ACETAMINOPHEN 650 MG RE SUPP: 650.0000 mg | Freq: Four times a day (QID) | RECTAL | Status: DC | PRN

## 2017-11-18 MED ORDER — MORPHINE SULFATE (PF) 2 MG/ML IV SOLN
2.0000 mg | INTRAVENOUS | Status: DC | PRN
  Administered 2017-11-19 – 2017-11-21 (×4): 2 mg via INTRAVENOUS
  Filled 2017-11-18 (×4): qty 1

## 2017-11-18 MED ORDER — ONDANSETRON HCL 4 MG/2ML IJ SOLN
4.0000 mg | Freq: Four times a day (QID) | INTRAMUSCULAR | Status: DC | PRN
  Administered 2017-11-20: 4 mg via INTRAVENOUS
  Filled 2017-11-18: qty 2

## 2017-11-18 MED ORDER — ONDANSETRON HCL 4 MG PO TABS: 4.0000 mg | ORAL_TABLET | Freq: Four times a day (QID) | ORAL | Status: DC | PRN
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
## 2017-11-18 NOTE — Progress Notes (Signed)
Orthopedic Tech Progress Note Patient Details:  Bruce Benson 03/09/1995 [id number]  Ortho Devices Type of Ortho Device: Crutches Ortho Device/Splint Interventions: Application   Post Interventions Patient Tolerated: Well Instructions Provided: Care of device   Nikki Dom 11/18/2017, 3:38 PM

## 2017-11-18 NOTE — Progress Notes (Signed)
   Subjective:  Patient reports pain as mild to moderate.  Denies numbness or paresthesias of the right foot.  He has been up with therapy.  He is anxious to go home as soon as he can.  He feels ready to go home but understands he will need a second surgery.  Objective:   VITALS:   Vitals:   11/18/17 0056 11/18/17 0440 11/18/17 0758 11/18/17 1139  BP: 135/77 113/69 (!) 101/53 112/66  Pulse: 76 60 (!) 47 (!) 43  Resp: 19 16 15 16   Temp: 98.3 F (36.8 C) 98.5 F (36.9 C) 97.9 F (36.6 C) 98 F (36.7 C)  TempSrc: Oral Oral Oral Oral  SpO2: 100% 99% 97% 98%  Weight:      Height:        Neurovascular intact Sensation intact distally Intact pulses distally Compartment soft Scant serosanguineous drainage noted from the pin sites. No pain with passive or active range of motion of the toes. Moderate swelling about the dorsum of the foot and distal tibia.  No wrinkle sign.  Lab Results  Component Value Date   WBC 9.9 11/18/2017   HGB 13.2 11/18/2017   HCT 39.7 11/18/2017   MCV 89.6 11/18/2017   PLT 164 11/18/2017   BMET    Component Value Date/Time   NA 140 10/01/2017 0357   NA 141 10/18/2011 0636   K 4.0 10/01/2017 0357   K 3.8 10/18/2011 0636   CL 104 10/01/2017 0357   CL 105 10/18/2011 0636   CO2 31 10/01/2017 0357   CO2 29 (H) 10/18/2011 0636   GLUCOSE 88 10/01/2017 0357   GLUCOSE 91 10/18/2011 0636   BUN 10 10/01/2017 0357   BUN 9 10/18/2011 0636   CREATININE 1.20 11/18/2017 0218   CREATININE 1.10 10/18/2011 0636   CALCIUM 9.2 10/01/2017 0357   CALCIUM 9.6 10/18/2011 0636   GFRNONAA >60 11/18/2017 0218   GFRAA >60 11/18/2017 0218     Assessment/Plan: 1 Day Post-Op   Active Problems:   Type I or II open pilon fracture of right tibia   Displaced pilon fracture of right tibia, initial encounter for open fracture type I or II   Advance diet Up with therapy -Nonweightbearing to the right lower extremity with strict elevation when not up out of  bed. -Lovenox for DVT prophylaxis.  -He will need a second procedure likely during this hospitalization.  I have asked Dr. Caryn Bee Haddix 1 of our orthopedic traumatologist to assess him for potential definitive fixation while admitted currently.   Yolonda Kida 11/18/2017, 3:10 PM   Maryan Rued, MD [phone]
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
## 2017-11-18 NOTE — Plan of Care (Signed)
  Problem: Education: Goal: Knowledge of General Education information will improve Description Including pain rating scale, medication(s)/side effects and non-pharmacologic comfort measures Outcome: Progressing   Problem: Health Behavior/Discharge Planning: Goal: Ability to manage health-related needs will improve Outcome: Progressing   Problem: Clinical Measurements: Goal: Ability to maintain clinical measurements within normal limits will improve Outcome: Progressing Goal: Will remain free from infection Outcome: Progressing Goal: Diagnostic test results will improve Outcome: Progressing Goal: Respiratory complications will improve Outcome: Progressing Goal: Cardiovascular complication will be avoided Outcome: Progressing   Problem: Activity: Goal: Risk for activity intolerance will decrease Outcome: Progressing   Problem: Nutrition: Goal: Adequate nutrition will be maintained Outcome: Progressing   Problem: Coping: Goal: Level of anxiety will decrease Outcome: Progressing   Problem: Elimination: Goal: Will not experience complications related to bowel motility Outcome: Progressing Goal: Will not experience complications related to urinary retention Outcome: Progressing   Problem: Pain Managment: Goal: General experience of comfort will improve Outcome: Progressing   Problem: Safety: Goal: Ability to remain free from injury will improve Outcome: Progressing   Problem: Skin Integrity: Goal: Risk for impaired skin integrity will decrease Outcome: Progressing   Problem: Education: Goal: Knowledge of the prescribed therapeutic regimen will improve Outcome: Progressing   Problem: Activity: Goal: Ability to increase mobility will improve Outcome: Progressing   Problem: Physical Regulation: Goal: Postoperative complications will be avoided or minimized Outcome: Progressing   Problem: Pain Management: Goal: Pain level will decrease with appropriate  interventions Outcome: Progressing
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
## 2017-11-18 NOTE — Anesthesia Postprocedure Evaluation (Signed)
Anesthesia Post Note  Patient: Bruce Benson  Procedure(s) Performed: IRRIGATION AND DEBRIDEMENT EXTREMITY (Right Ankle) EXTERNAL FIXATION, RIGHT LOWER LEG (Right Ankle)     Patient location during evaluation: PACU Anesthesia Type: General Level of consciousness: awake and alert Pain management: pain level controlled Vital Signs Assessment: post-procedure vital signs reviewed and stable Respiratory status: spontaneous breathing, nonlabored ventilation, respiratory function stable and patient connected to nasal cannula oxygen Cardiovascular status: blood pressure returned to baseline and stable Postop Assessment: no apparent nausea or vomiting Anesthetic complications: no    Last Vitals:  Vitals:   11/18/17 0056 11/18/17 0440  BP: 135/77 113/69  Pulse: 76 60  Resp: 19 16  Temp: 36.8 C 36.9 C  SpO2: 100% 99%    Last Pain:  Vitals:   11/18/17 0650  TempSrc:   PainSc: 6                  [name]
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
## 2017-11-19 ENCOUNTER — Encounter (HOSPITAL_COMMUNITY): Payer: Self-pay | Admitting: Orthopedic Surgery

## 2017-11-19 MED ORDER — SODIUM CHLORIDE 0.9 % IV SOLN
2.0000 g | INTRAVENOUS | Status: DC
  Administered 2017-11-19: 2 g via INTRAVENOUS
  Filled 2017-11-19 (×2): qty 20

## 2017-11-19 NOTE — Plan of Care (Signed)
  Problem: Education: Goal: Knowledge of General Education information will improve Description: Including pain rating scale, medication(s)/side effects and non-pharmacologic comfort measures Outcome: Progressing   Problem: Pain Management: Goal: Pain level will decrease with appropriate interventions Outcome: Progressing   
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
## 2017-11-20 ENCOUNTER — Inpatient Hospital Stay (HOSPITAL_COMMUNITY): Payer: Self-pay | Admitting: Anesthesiology

## 2017-11-20 ENCOUNTER — Inpatient Hospital Stay (HOSPITAL_COMMUNITY): Payer: Self-pay

## 2017-11-20 ENCOUNTER — Encounter (HOSPITAL_COMMUNITY)
Admission: EM | Disposition: A | Discharge status: Home / Self Care | Payer: Self-pay | Source: Home / Self Care | Attending: Student

## 2017-11-20 HISTORY — PX: EXTERNAL FIXATION LEG: SHX1549

## 2017-11-20 SURGERY — EXTERNAL FIXATION, LOWER EXTREMITY
Anesthesia: General | Site: Leg Lower | Laterality: Right

## 2017-11-20 MED ORDER — TOBRAMYCIN SULFATE 1.2 G IJ SOLR
INTRAMUSCULAR | Status: DC | PRN
  Administered 2017-11-20: 1.2 g via TOPICAL

## 2017-11-20 MED ORDER — MIDAZOLAM HCL 2 MG/2ML IJ SOLN
INTRAMUSCULAR | Status: AC
  Filled 2017-11-20: qty 2

## 2017-11-20 MED ORDER — ONDANSETRON HCL 4 MG/2ML IJ SOLN
INTRAMUSCULAR | Status: AC
  Filled 2017-11-20: qty 2

## 2017-11-20 MED ORDER — HYDROMORPHONE HCL 1 MG/ML IJ SOLN
INTRAMUSCULAR | Status: AC
  Administered 2017-11-20: 0.5 mg via INTRAVENOUS
  Filled 2017-11-20: qty 1

## 2017-11-20 MED ORDER — SODIUM CHLORIDE 0.9 % IR SOLN
Status: DC | PRN
  Administered 2017-11-20: 3000 mL

## 2017-11-20 MED ORDER — HYDROMORPHONE HCL 1 MG/ML IJ SOLN
0.2500 mg | INTRAMUSCULAR | Status: DC | PRN
  Administered 2017-11-20 (×4): 0.5 mg via INTRAVENOUS

## 2017-11-20 MED ORDER — DEXAMETHASONE SODIUM PHOSPHATE 10 MG/ML IJ SOLN
INTRAMUSCULAR | Status: AC
  Filled 2017-11-20: qty 1

## 2017-11-20 MED ORDER — MIDAZOLAM HCL 2 MG/2ML IJ SOLN
INTRAMUSCULAR | Status: DC | PRN
  Administered 2017-11-20: 2 mg via INTRAVENOUS

## 2017-11-20 MED ORDER — SCOPOLAMINE 1 MG/3DAYS TD PT72
MEDICATED_PATCH | TRANSDERMAL | Status: DC | PRN
  Administered 2017-11-20: 1 via TRANSDERMAL

## 2017-11-20 MED ORDER — LACTATED RINGERS IV SOLN
INTRAVENOUS | Status: DC | PRN
  Administered 2017-11-20 (×2): via INTRAVENOUS

## 2017-11-20 MED ORDER — PROPOFOL 10 MG/ML IV BOLUS
INTRAVENOUS | Status: AC
  Filled 2017-11-20: qty 20

## 2017-11-20 MED ORDER — PROMETHAZINE HCL 25 MG/ML IJ SOLN: 6.2500 mg | INTRAMUSCULAR | Status: DC | PRN

## 2017-11-20 MED ORDER — MIDAZOLAM HCL 2 MG/2ML IJ SOLN
0.5000 mg | Freq: Once | INTRAMUSCULAR | Status: AC | PRN
  Administered 2017-11-20: 2 mg via INTRAVENOUS

## 2017-11-20 MED ORDER — CEFAZOLIN SODIUM 1 G IJ SOLR
INTRAMUSCULAR | Status: AC
  Filled 2017-11-20: qty 20

## 2017-11-20 MED ORDER — ROCURONIUM BROMIDE 10 MG/ML (PF) SYRINGE
PREFILLED_SYRINGE | INTRAVENOUS | Status: DC | PRN
  Administered 2017-11-20: 50 mg via INTRAVENOUS

## 2017-11-20 MED ORDER — VANCOMYCIN HCL 1000 MG IV SOLR
INTRAVENOUS | Status: DC | PRN
  Administered 2017-11-20: 1000 mg via TOPICAL

## 2017-11-20 MED ORDER — FENTANYL CITRATE (PF) 250 MCG/5ML IJ SOLN
INTRAMUSCULAR | Status: DC | PRN
  Administered 2017-11-20 (×2): 50 ug via INTRAVENOUS
  Administered 2017-11-20: 100 ug via INTRAVENOUS
  Administered 2017-11-20: 50 ug via INTRAVENOUS

## 2017-11-20 MED ORDER — FENTANYL CITRATE (PF) 250 MCG/5ML IJ SOLN
INTRAMUSCULAR | Status: AC
  Filled 2017-11-20: qty 5

## 2017-11-20 MED ORDER — PROPOFOL 10 MG/ML IV BOLUS
INTRAVENOUS | Status: DC | PRN
  Administered 2017-11-20: 200 mg via INTRAVENOUS

## 2017-11-20 MED ORDER — SUGAMMADEX SODIUM 200 MG/2ML IV SOLN
INTRAVENOUS | Status: DC | PRN
  Administered 2017-11-20: 150 mg via INTRAVENOUS

## 2017-11-20 MED ORDER — BACITRACIN ZINC 500 UNIT/GM EX OINT
TOPICAL_OINTMENT | CUTANEOUS | Status: AC
  Filled 2017-11-20: qty 28.35

## 2017-11-20 MED ORDER — LIDOCAINE 2% (20 MG/ML) 5 ML SYRINGE
INTRAMUSCULAR | Status: AC
  Filled 2017-11-20: qty 5

## 2017-11-20 MED ORDER — TOBRAMYCIN SULFATE 1.2 G IJ SOLR
INTRAMUSCULAR | Status: AC
  Filled 2017-11-20: qty 1.2

## 2017-11-20 MED ORDER — VANCOMYCIN HCL 1000 MG IV SOLR
INTRAVENOUS | Status: AC
  Filled 2017-11-20: qty 1000

## 2017-11-20 MED ORDER — DEXAMETHASONE SODIUM PHOSPHATE 10 MG/ML IJ SOLN
INTRAMUSCULAR | Status: DC | PRN
  Administered 2017-11-20: 10 mg via INTRAVENOUS

## 2017-11-20 MED ORDER — LIDOCAINE 2% (20 MG/ML) 5 ML SYRINGE
INTRAMUSCULAR | Status: DC | PRN
  Administered 2017-11-20: 30 mg via INTRAVENOUS

## 2017-11-20 MED ORDER — CEFAZOLIN SODIUM-DEXTROSE 2-4 GM/100ML-% IV SOLN
2.0000 g | INTRAVENOUS | Status: AC
  Administered 2017-11-20: 2 g via INTRAVENOUS

## 2017-11-20 MED ORDER — ONDANSETRON HCL 4 MG/2ML IJ SOLN
INTRAMUSCULAR | Status: DC | PRN
  Administered 2017-11-20: 4 mg via INTRAVENOUS

## 2017-11-20 MED ORDER — CEFAZOLIN SODIUM-DEXTROSE 2-4 GM/100ML-% IV SOLN
2.0000 g | Freq: Three times a day (TID) | INTRAVENOUS | Status: AC
  Administered 2017-11-20 – 2017-11-21 (×3): 2 g via INTRAVENOUS
  Filled 2017-11-20 (×3): qty 100

## 2017-11-20 MED ORDER — MEPERIDINE HCL 50 MG/ML IJ SOLN: 6.2500 mg | INTRAMUSCULAR | Status: DC | PRN

## 2017-11-20 MED ORDER — 0.9 % SODIUM CHLORIDE (POUR BTL) OPTIME
TOPICAL | Status: DC | PRN
  Administered 2017-11-20: 1000 mL

## 2017-11-20 SURGICAL SUPPLY — 55 items
BANDAGE ACE 4X5 VEL STRL LF (GAUZE/BANDAGES/DRESSINGS) ×1 IMPLANT
BANDAGE ACE 6X5 VEL STRL LF (GAUZE/BANDAGES/DRESSINGS) ×1 IMPLANT
BIT DRILL 2.5 X LONG (BIT) ×1
BIT DRILL QC 3.5X110 (BIT) ×1 IMPLANT
BIT DRILL X LONG 2.5 (BIT) IMPLANT
BNDG COHESIVE 4X5 TAN STRL (GAUZE/BANDAGES/DRESSINGS) ×2 IMPLANT
BNDG GAUZE ELAST 4 BULKY (GAUZE/BANDAGES/DRESSINGS) ×4 IMPLANT
BRUSH SCRUB SURG 4.25 DISP (MISCELLANEOUS) ×4 IMPLANT
CHLORAPREP W/TINT 26ML (MISCELLANEOUS) ×5 IMPLANT
CLAMP COMBI 8.0/11.0 LRG/MED (Clamp) ×2 IMPLANT
CLAMP COMBO MED CLIP-ON SELF (Clamp) ×4 IMPLANT
COVER SURGICAL LIGHT HANDLE (MISCELLANEOUS) ×3 IMPLANT
COVER WAND RF STERILE (DRAPES) ×2 IMPLANT
DRAPE C-ARM 42X72 X-RAY (DRAPES) ×1 IMPLANT
DRAPE C-ARMOR (DRAPES) ×2 IMPLANT
DRAPE IMP U-DRAPE 54X76 (DRAPES) ×4 IMPLANT
DRAPE ORTHO SPLIT 77X108 STRL (DRAPES) ×2
DRAPE SURG ORHT 6 SPLT 77X108 (DRAPES) ×2 IMPLANT
DRAPE U-SHAPE 47X51 STRL (DRAPES) ×2 IMPLANT
DRILL BIT X LONG 2.5 (BIT) ×1
DRSG MEPITEL 4X7.2 (GAUZE/BANDAGES/DRESSINGS) IMPLANT
ELECT REM PT RETURN 9FT ADLT (ELECTROSURGICAL) ×2
ELECTRODE REM PT RTRN 9FT ADLT (ELECTROSURGICAL) ×1 IMPLANT
GAUZE SPONGE 4X4 12PLY STRL (GAUZE/BANDAGES/DRESSINGS) ×2 IMPLANT
GLOVE BIO SURGEON STRL SZ7.5 (GLOVE) ×8 IMPLANT
GLOVE BIOGEL PI IND STRL 7.5 (GLOVE) ×1 IMPLANT
GLOVE BIOGEL PI INDICATOR 7.5 (GLOVE) ×1
GOWN STRL REUS W/ TWL LRG LVL3 (GOWN DISPOSABLE) ×2 IMPLANT
GOWN STRL REUS W/TWL LRG LVL3 (GOWN DISPOSABLE) ×2
HANDPIECE INTERPULSE COAX TIP (DISPOSABLE) ×1
KIT BASIN OR (CUSTOM PROCEDURE TRAY) ×2 IMPLANT
KIT TURNOVER KIT B (KITS) ×2 IMPLANT
MANIFOLD NEPTUNE II (INSTRUMENTS) ×2 IMPLANT
NEEDLE 22X1 1/2 (OR ONLY) (NEEDLE) IMPLANT
NS IRRIG 1000ML POUR BTL (IV SOLUTION) ×2 IMPLANT
PACK ORTHO EXTREMITY (CUSTOM PROCEDURE TRAY) ×2 IMPLANT
PAD ARMBOARD 7.5X6 YLW CONV (MISCELLANEOUS) ×4 IMPLANT
PADDING CAST COTTON 6X4 STRL (CAST SUPPLIES) ×2 IMPLANT
ROD CARBON FIBER 8.0X160MM (Rod) ×1 IMPLANT
ROD CRBN FBR 8.0X120 (Rod) ×1 IMPLANT
SCREW HEADED ST 3.5X100 (Screw) ×1 IMPLANT
SCREW HEADED ST 3.5X42 (Screw) ×1 IMPLANT
SCREW HEADED ST 3.5X48 (Screw) ×1 IMPLANT
SCREW SHANZ 4.0X100 (EXFIX) ×2 IMPLANT
SCREW SHANZ 5.0X175MM (Screw) ×1 IMPLANT
SET HNDPC FAN SPRY TIP SCT (DISPOSABLE) IMPLANT
SPONGE LAP 18X18 X RAY DECT (DISPOSABLE) ×2 IMPLANT
STAPLER VISISTAT 35W (STAPLE) ×2 IMPLANT
STRIP CLOSURE SKIN 1/2X4 (GAUZE/BANDAGES/DRESSINGS) IMPLANT
SUT ETHILON 3 0 PS 1 (SUTURE) ×2 IMPLANT
SUT MON AB 2-0 CT1 36 (SUTURE) ×2 IMPLANT
SUT PROLENE 0 CT (SUTURE) IMPLANT
TOWEL OR 17X24 6PK STRL BLUE (TOWEL DISPOSABLE) ×4 IMPLANT
TOWEL OR 17X26 10 PK STRL BLUE (TOWEL DISPOSABLE) ×4 IMPLANT
UNDERPAD 30X30 (UNDERPADS AND DIAPERS) ×2 IMPLANT

## 2017-11-20 NOTE — Anesthesia Postprocedure Evaluation (Signed)
Anesthesia Post Note  Patient: Bruce Benson  Procedure(s) Performed: ADJUSTMENT OF EXTERNAL FIXATOR, REPEAT IRRIGATION AND DEBRIDEMENT AND PERCUTANEOUS FIXATION OF ANKLE JOINT (Right Leg Lower)     Patient location during evaluation: PACU Anesthesia Type: General Level of consciousness: sedated, patient cooperative and oriented Pain management: pain level controlled Vital Signs Assessment: post-procedure vital signs reviewed and stable Respiratory status: spontaneous breathing, nonlabored ventilation and respiratory function stable Cardiovascular status: blood pressure returned to baseline and stable Postop Assessment: no apparent nausea or vomiting Anesthetic complications: no    Last Vitals:  Vitals:   11/20/17 1315 11/20/17 1320  BP:  117/74  Pulse: (!) 44 (!) 46  Resp: 15 15  Temp:  36.8 C  SpO2: 100% 100%    Last Pain:  Vitals:   11/20/17 1245  TempSrc:   PainSc: Asleep                 Iesha Summerhill,E. Telesia Ates

## 2017-11-20 NOTE — Plan of Care (Signed)

## 2017-11-20 NOTE — Anesthesia Procedure Notes (Signed)
Procedure Name: Intubation Date/Time: 11/20/2017 8:35 AM Performed by: Bryson Corona, CRNA Pre-anesthesia Checklist: Patient identified, Emergency Drugs available, Suction available and Patient being monitored Patient Re-evaluated:Patient Re-evaluated prior to induction Oxygen Delivery Method: Circle System Utilized Preoxygenation: Pre-oxygenation with 100% oxygen Induction Type: IV induction Ventilation: Mask ventilation without difficulty Laryngoscope Size: Mac and 4 Grade View: Grade I Tube type: Oral Tube size: 7.0 mm Number of attempts: 1 Airway Equipment and Method: Stylet Placement Confirmation: ETT inserted through vocal cords under direct vision,  positive ETCO2 and breath sounds checked- equal and bilateral Secured at: 23 cm Tube secured with: Tape Dental Injury: Teeth and Oropharynx as per pre-operative assessment

## 2017-11-20 NOTE — Anesthesia Preprocedure Evaluation (Addendum)
Anesthesia Evaluation  Patient identified by MRN, date of birth, ID band Patient awake    Reviewed: Allergy & Precautions, NPO status , Patient's Chart, lab work & pertinent test results  History of Anesthesia Complications Negative for: history of anesthetic complications  Airway Mallampati: I  TM Distance: >3 FB Neck ROM: Full    Dental  (+) Dental Advisory Given, Teeth Intact   Pulmonary neg pulmonary ROS,    breath sounds clear to auscultation       Cardiovascular negative cardio ROS   Rhythm:Regular Rate:Normal     Neuro/Psych negative neurological ROS     GI/Hepatic negative GI ROS, (+)     substance abuse  marijuana use,   Endo/Other  negative endocrine ROS  Renal/GU negative Renal ROS     Musculoskeletal   Abdominal   Peds  Hematology negative hematology ROS (+)   Anesthesia Other Findings   Reproductive/Obstetrics                            Anesthesia Physical Anesthesia Plan  ASA: II  Anesthesia Plan: General   Post-op Pain Management:    Induction: Intravenous  PONV Risk Score and Plan: 3 and Ondansetron, Dexamethasone and Scopolamine patch - Pre-op  Airway Management Planned: Oral ETT  Additional Equipment:   Intra-op Plan:   Post-operative Plan: Extubation in OR  Informed Consent: I have reviewed the patients History and Physical, chart, labs and discussed the procedure including the risks, benefits and alternatives for the proposed anesthesia with the patient or authorized representative who has indicated his/her understanding and acceptance.   Dental advisory given  Plan Discussed with: CRNA and Surgeon  Anesthesia Plan Comments: (Plan routine monitors, GETA)        Anesthesia Quick Evaluation

## 2017-11-20 NOTE — Transfer of Care (Signed)
Immediate Anesthesia Transfer of Care Note  Patient: Bruce Benson  Procedure(s) Performed: ADJUSTMENT OF EXTERNAL FIXATOR, REPEAT IRRIGATION AND DEBRIDEMENT AND PERCUTANEOUS FIXATION OF ANKLE JOINT (Right Leg Lower)  Patient Location: PACU  Anesthesia Type:General  Level of Consciousness: awake, alert  and oriented  Airway & Oxygen Therapy: Patient Spontanous Breathing and Patient connected to nasal cannula oxygen  Post-op Assessment: Report given to RN and Post -op Vital signs reviewed and stable  Post vital signs: Reviewed and stable  Last Vitals:  Vitals Value Taken Time  BP 122/65 11/20/2017 11:21 AM  Temp    Pulse 57 11/20/2017 11:23 AM  Resp 18 11/20/2017 11:23 AM  SpO2 100 % 11/20/2017 11:23 AM  Vitals shown include unvalidated device data.  Last Pain:  Vitals:   11/20/17 0606  TempSrc: Oral  PainSc:       Patients Stated Pain Goal: 6 (11/19/17 1433)  Complications: No apparent anesthesia complications
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
## 2017-11-20 NOTE — Interval H&P Note (Signed)
History and Physical Interval Note:  11/20/2017 8:10 AM  Bruce Benson  has presented today for surgery, with the diagnosis of Right open pilon fracture  The various methods of treatment have been discussed with the patient and family. After consideration of risks, benefits and other options for treatment, the patient has consented to  Procedure(s): ADJUSTMENT OF EXTERNAL FIXATOR, REPEAT IRRIGATION AND DEBRIDEMENT AND PERCUTANEOUS FIXATION OF ANKLE JOINT (Right) as a surgical intervention .  The patient's history has been reviewed, patient examined, no change in status, stable for surgery.  I have reviewed the patient's chart and labs.  Questions were answered to the patient's satisfaction.     Caryn Bee P Haddix

## 2017-11-21 ENCOUNTER — Encounter (HOSPITAL_COMMUNITY): Payer: Self-pay | Admitting: Student

## 2017-11-21 MED ORDER — ASPIRIN EC 325 MG PO TBEC: 325.0000 mg | DELAYED_RELEASE_TABLET | Freq: Every day | ORAL | 0 refills | Status: AC

## 2017-11-21 MED ORDER — OXYCODONE HCL 5 MG PO TABS: 5.0000 mg | ORAL_TABLET | ORAL | 0 refills | Status: DC | PRN

## 2017-11-21 MED ORDER — METHOCARBAMOL 500 MG PO TABS: 500.0000 mg | ORAL_TABLET | Freq: Four times a day (QID) | ORAL | 1 refills | Status: DC | PRN

## 2017-11-21 NOTE — Discharge Instructions (Signed)
[facility] Discharge Instructions   General Discharge Instructions  WEIGHT BEARING STATUS: Nonweightbearing to right leg  RANGE OF MOTION/ACTIVITY: Okay to move knee and toes  Wound Care: Keep covered until follow up appointment. If significant drainage, you may change dressing as noted below.  DVT/PE prophylaxis: Take a 325mg  aspirin once a day to prevent blood clots  Diet: as you were eating previously.  Can use over the counter stool softeners and bowel preparations, such as Miralax, to help with bowel movements.  Narcotics can be constipating.  Be sure to drink plenty of fluids  PAIN MEDICATION USE AND EXPECTATIONS  You have likely been given narcotic medications to help control your pain.  After a traumatic event that results in an fracture (broken bone) with or without surgery, it is ok to use narcotic pain medications to help control one's pain.  We understand that everyone responds to pain differently and each individual patient will be evaluated on a regular basis for the continued need for narcotic medications. Ideally, narcotic medication use should last no more than 6-8 weeks (coinciding with fracture healing).   As a patient it is your responsibility as well to monitor narcotic medication use and report the amount and frequency you use these medications when you come to your office visit.   We would also advise that if you are using narcotic medications, you should take a dose prior to therapy to maximize you participation.  IF YOU ARE ON NARCOTIC MEDICATIONS IT IS NOT PERMISSIBLE TO OPERATE A MOTOR VEHICLE (MOTORCYCLE/CAR/TRUCK/MOPED) OR HEAVY MACHINERY DO NOT MIX NARCOTICS WITH OTHER CNS (CENTRAL NERVOUS SYSTEM) DEPRESSANTS SUCH AS ALCOHOL   STOP SMOKING OR USING NICOTINE PRODUCTS!!!!  As discussed nicotine severely impairs your body's ability to heal surgical and traumatic wounds but also impairs bone healing.  Wounds and bone heal by forming microscopic blood  vessels (angiogenesis) and nicotine is a vasoconstrictor (essentially, shrinks blood vessels).  Therefore, if vasoconstriction occurs to these microscopic blood vessels they essentially disappear and are unable to deliver necessary nutrients to the healing tissue.  This is one modifiable factor that you can do to dramatically increase your chances of healing your injury.    (This means no smoking, no nicotine gum, patches, etc)  DO NOT USE NONSTEROIDAL ANTI-INFLAMMATORY DRUGS (NSAID'S)  Using products such as Advil (ibuprofen), Aleve (naproxen), Motrin (ibuprofen) for additional pain control during fracture healing can delay and/or prevent the healing response.  If you would like to take over the counter (OTC) medication, Tylenol (acetaminophen) is ok.  However, some narcotic medications that are given for pain control contain acetaminophen as well. Therefore, you should not exceed more than 4000 mg of tylenol in a day if you do not have liver disease.  Also note that there are may OTC medicines, such as cold medicines and allergy medicines that my contain tylenol as well.  If you have any questions about medications and/or interactions please ask your doctor/PA or your pharmacist.      ICE AND ELEVATE INJURED/OPERATIVE EXTREMITY  Using ice and elevating the injured extremity above your heart can help with swelling and pain control.  Icing in a pulsatile fashion, such as 20 minutes on and 20 minutes off, can be followed.    Do not place ice directly on skin. Make sure there is a barrier between to skin and the ice pack.    Using frozen items such as frozen peas works well as the conform nicely to the are that needs  to be iced.  USE AN ACE WRAP OR TED HOSE FOR SWELLING CONTROL  In addition to icing and elevation, Ace wraps or TED hose are used to help limit and resolve swelling.  It is recommended to use Ace wraps or TED hose until you are informed to stop.    When using Ace Wraps start the wrapping  distally (farthest away from the body) and wrap proximally (closer to the body)   Example: If you had surgery on your leg or thing and you do not have a splint on, start the ace wrap at the toes and work your way up to the thigh        If you had surgery on your upper extremity and do not have a splint on, start the ace wrap at your fingers and work your way up to the upper arm  CALL THE OFFICE WITH ANY QUESTIONS OR CONCERNS: [phone]        Discharge Pin Site Instructions  Dress pins daily with Kerlix roll starting on POD 2. Wrap the Kerlix so that it tamps the skin down around the pin-skin interface to prevent/limit motion of the skin relative to the pin.  (Pin-skin motion is the primary cause of pain and infection related to external fixator pin sites).  Remove any crust or coagulum that may obstruct drainage with a saline moistened gauze or soap and water.  After POD 3, if there is no discernable drainage on the pin site dressing, the interval for change can by increased to every other day.  You may shower with the fixator, cleaning all pin sites gently with soap and water.  If you have a surgical wound this needs to be completely dry and without drainage before showering.  The extremity can be lifted by the fixator to facilitate wound care and transfers.  Notify the office/Doctor if you experience increasing drainage, redness, or pain from a pin site, or if you notice purulent (thick, snot-like) drainage.  Discharge Wound Care Instructions  Do NOT apply any ointments, solutions or lotions to pin sites or surgical wounds.  These prevent needed drainage and even though solutions like hydrogen peroxide kill bacteria, they also damage cells lining the pin sites that help fight infection.  Applying lotions or ointments can keep the wounds moist and can cause them to breakdown and open up as well. This can increase the risk for infection. When in doubt call the office.  Surgical  incisions should be dressed daily.  If any drainage is noted, use one layer of adaptic, then gauze, Kerlix, and an ace wrap.  Once the incision is completely dry and without drainage, it may be left open to air out.  Showering may begin 36-48 hours later.  Cleaning gently with soap and water.  Traumatic wounds should be dressed daily as well.    One layer of adaptic, gauze, Kerlix, then ace wrap.  The adaptic can be discontinued once the draining has ceased

## 2017-11-21 NOTE — Plan of Care (Signed)
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
## 2017-11-21 NOTE — Progress Notes (Addendum)
Patient given all discharge instructions. Per paperwork he is to pick up medications at this pharmacy. All belongings returned and all questions answered. PIV x1 removed. Patient discharged home via wheelchair with family.

## 2017-11-26 ENCOUNTER — Ambulatory Visit: Payer: Self-pay | Admitting: Student

## 2017-11-26 DIAGNOSIS — S82871B Displaced pilon fracture of right tibia, initial encounter for open fracture type I or II: Secondary | ICD-10-CM

## 2017-11-28 NOTE — Anesthesia Preprocedure Evaluation (Addendum)
Anesthesia Evaluation  Patient identified by MRN, date of birth, ID band Patient awake    Reviewed: Allergy & Precautions, NPO status , Patient's Chart, lab work & pertinent test results  Airway Mallampati: I  TM Distance: >3 FB Neck ROM: Full    Dental no notable dental hx. (+) Teeth Intact, Dental Advisory Given   Pulmonary neg pulmonary ROS,    Pulmonary exam normal breath sounds clear to auscultation       Cardiovascular negative cardio ROS Normal cardiovascular exam Rhythm:Regular Rate:Normal     Neuro/Psych negative neurological ROS  negative psych ROS   GI/Hepatic negative GI ROS, (+)     substance abuse  marijuana use,   Endo/Other  negative endocrine ROS  Renal/GU negative Renal ROS     Musculoskeletal negative musculoskeletal ROS (+)   Abdominal   Peds  Hematology negative hematology ROS (+)   Anesthesia Other Findings Day of surgery medications reviewed with the patient.  Reproductive/Obstetrics                           Anesthesia Physical Anesthesia Plan  ASA: I  Anesthesia Plan: General   Post-op Pain Management:    Induction: Intravenous  PONV Risk Score and Plan: 2 and Ondansetron, Dexamethasone and Treatment may vary due to age or medical condition  Airway Management Planned: Oral ETT  Additional Equipment:   Intra-op Plan:   Post-operative Plan: Extubation in OR  Informed Consent: I have reviewed the patients History and Physical, chart, labs and discussed the procedure including the risks, benefits and alternatives for the proposed anesthesia with the patient or authorized representative who has indicated his/her understanding and acceptance.   Dental advisory given  Plan Discussed with: CRNA and Surgeon  Anesthesia Plan Comments: (Postop pain block discussed, pt refused.)       Anesthesia Quick Evaluation

## 2017-11-28 NOTE — Progress Notes (Signed)
I was unable to reach patient by phone.  I left  A message on voice mail.  I instructed the patient to arrive at [facility] Main entrance at 5:30  , nothing to eat or drink after midnight.   I instructed the patient to take the following,mif needed,  medications in the am with just enough water to get them down: Oxycodone, Robaxin. I asked patient to shower, wear clean clothes.  Do not wear any lotions, powders, cologne, jewelry, piercing, make-up or nail polish.  I asked the patient to call [phone], in the am if there were any questions or problems.

## 2017-11-29 ENCOUNTER — Other Ambulatory Visit: Payer: Self-pay

## 2017-11-29 ENCOUNTER — Observation Stay (HOSPITAL_COMMUNITY)
Admission: RE | Admit: 2017-11-29 | Discharge: 2017-11-30 | Disposition: A | Discharge status: Home / Self Care | Payer: Self-pay | Source: Ambulatory Visit | Attending: Student | Admitting: Student

## 2017-11-29 ENCOUNTER — Encounter (HOSPITAL_COMMUNITY)
Admission: RE | Disposition: A | Discharge status: Home / Self Care | Payer: Self-pay | Source: Ambulatory Visit | Attending: Student

## 2017-11-29 ENCOUNTER — Inpatient Hospital Stay (HOSPITAL_COMMUNITY): Payer: Self-pay | Admitting: Anesthesiology

## 2017-11-29 ENCOUNTER — Inpatient Hospital Stay (HOSPITAL_COMMUNITY): Payer: Self-pay

## 2017-11-29 ENCOUNTER — Observation Stay (HOSPITAL_COMMUNITY): Payer: Self-pay

## 2017-11-29 ENCOUNTER — Encounter (HOSPITAL_COMMUNITY): Payer: Self-pay | Admitting: *Deleted

## 2017-11-29 DIAGNOSIS — Z79899 Other long term (current) drug therapy: Secondary | ICD-10-CM | POA: Insufficient documentation

## 2017-11-29 DIAGNOSIS — S8251XA Displaced fracture of medial malleolus of right tibia, initial encounter for closed fracture: Secondary | ICD-10-CM | POA: Insufficient documentation

## 2017-11-29 DIAGNOSIS — T148XXA Other injury of unspecified body region, initial encounter: Secondary | ICD-10-CM

## 2017-11-29 DIAGNOSIS — S82871B Displaced pilon fracture of right tibia, initial encounter for open fracture type I or II: Secondary | ICD-10-CM | POA: Insufficient documentation

## 2017-11-29 DIAGNOSIS — S82871C Displaced pilon fracture of right tibia, initial encounter for open fracture type IIIA, IIIB, or IIIC: Principal | ICD-10-CM | POA: Insufficient documentation

## 2017-11-29 DIAGNOSIS — Z7982 Long term (current) use of aspirin: Secondary | ICD-10-CM | POA: Insufficient documentation

## 2017-11-29 HISTORY — PX: ORIF TIBIA FRACTURE: SHX5416

## 2017-11-29 HISTORY — DX: Other specified health status: Z78.9

## 2017-11-29 LAB — COMPREHENSIVE METABOLIC PANEL
ALBUMIN: 4.3 g/dL (ref 3.5–5.0)
ALK PHOS: 48 U/L (ref 38–126)
ALT: 31 U/L (ref 0–44)
ANION GAP: 10 (ref 5–15)
AST: 34 U/L (ref 15–41)
BUN: 21 mg/dL — ABNORMAL HIGH (ref 6–20)
CALCIUM: 9.5 mg/dL (ref 8.9–10.3)
CO2: 24 mmol/L (ref 22–32)
Chloride: 101 mmol/L (ref 98–111)
Creatinine, Ser: 1.36 mg/dL — ABNORMAL HIGH (ref 0.61–1.24)
GFR calc Af Amer: >60 mL/min (ref >60)
GFR calc non Af Amer: >60 mL/min (ref >60)
GLUCOSE: 88 mg/dL (ref 70–99)
Potassium: 3.7 mmol/L (ref 3.5–5.1)
SODIUM: 135 mmol/L (ref 135–145)
Total Bilirubin: 0.7 mg/dL (ref 0.3–1.2)
Total Protein: 7.6 g/dL (ref 6.5–8.1)

## 2017-11-29 LAB — HEMOGLOBIN: Hemoglobin: 12.9 g/dL — ABNORMAL LOW (ref 13.0–17.0)

## 2017-11-29 SURGERY — OPEN REDUCTION INTERNAL FIXATION (ORIF) TIBIA FRACTURE
Anesthesia: General | Site: Leg Lower | Laterality: Right

## 2017-11-29 MED ORDER — CEFAZOLIN SODIUM-DEXTROSE 2-4 GM/100ML-% IV SOLN
2.0000 g | INTRAVENOUS | Status: AC
  Administered 2017-11-29: 2 g via INTRAVENOUS

## 2017-11-29 MED ORDER — ACETAMINOPHEN 650 MG RE SUPP: 650.0000 mg | Freq: Four times a day (QID) | RECTAL | Status: DC | PRN

## 2017-11-29 MED ORDER — METHOCARBAMOL 1000 MG/10ML IJ SOLN
500.0000 mg | Freq: Four times a day (QID) | INTRAVENOUS | Status: DC | PRN
  Filled 2017-11-29: qty 5

## 2017-11-29 MED ORDER — OXYCODONE HCL 5 MG PO TABS
5.0000 mg | ORAL_TABLET | ORAL | Status: DC | PRN
  Administered 2017-11-30: 10 mg via ORAL
  Filled 2017-11-29: qty 2

## 2017-11-29 MED ORDER — MIDAZOLAM HCL 2 MG/2ML IJ SOLN
INTRAMUSCULAR | Status: AC
  Filled 2017-11-29: qty 2

## 2017-11-29 MED ORDER — FENTANYL CITRATE (PF) 250 MCG/5ML IJ SOLN
INTRAMUSCULAR | Status: DC | PRN
  Administered 2017-11-29: 50 ug via INTRAVENOUS
  Administered 2017-11-29: 100 ug via INTRAVENOUS
  Administered 2017-11-29: 50 ug via INTRAVENOUS
  Administered 2017-11-29 (×3): 100 ug via INTRAVENOUS

## 2017-11-29 MED ORDER — GABAPENTIN 100 MG PO CAPS
100.0000 mg | ORAL_CAPSULE | Freq: Three times a day (TID) | ORAL | Status: DC
  Administered 2017-11-29 – 2017-11-30 (×2): 100 mg via ORAL
  Filled 2017-11-29 (×2): qty 1

## 2017-11-29 MED ORDER — DIPHENHYDRAMINE HCL 12.5 MG/5ML PO ELIX: 12.5000 mg | ORAL_SOLUTION | ORAL | Status: DC | PRN

## 2017-11-29 MED ORDER — PROPOFOL 10 MG/ML IV BOLUS
INTRAVENOUS | Status: DC | PRN
  Administered 2017-11-29: 180 mg via INTRAVENOUS

## 2017-11-29 MED ORDER — VANCOMYCIN HCL 1000 MG IV SOLR
INTRAVENOUS | Status: DC | PRN
  Administered 2017-11-29: 1000 mg via TOPICAL

## 2017-11-29 MED ORDER — SUGAMMADEX SODIUM 200 MG/2ML IV SOLN
INTRAVENOUS | Status: DC | PRN
  Administered 2017-11-29: 200 mg via INTRAVENOUS

## 2017-11-29 MED ORDER — ACETAMINOPHEN 10 MG/ML IV SOLN: 1000.0000 mg | Freq: Once | INTRAVENOUS | Status: DC | PRN

## 2017-11-29 MED ORDER — MIDAZOLAM HCL 5 MG/5ML IJ SOLN
INTRAMUSCULAR | Status: DC | PRN
  Administered 2017-11-29: 2 mg via INTRAVENOUS

## 2017-11-29 MED ORDER — ONDANSETRON HCL 4 MG/2ML IJ SOLN
INTRAMUSCULAR | Status: DC | PRN
  Administered 2017-11-29: 4 mg via INTRAVENOUS

## 2017-11-29 MED ORDER — VANCOMYCIN HCL 1000 MG IV SOLR
INTRAVENOUS | Status: AC
  Filled 2017-11-29: qty 1000

## 2017-11-29 MED ORDER — POLYETHYLENE GLYCOL 3350 17 G PO PACK: 17.0000 g | PACK | Freq: Every day | ORAL | Status: DC | PRN

## 2017-11-29 MED ORDER — MEPERIDINE HCL 50 MG/ML IJ SOLN: 6.2500 mg | INTRAMUSCULAR | Status: DC | PRN

## 2017-11-29 MED ORDER — TOBRAMYCIN SULFATE 1.2 G IJ SOLR
INTRAMUSCULAR | Status: DC | PRN
  Administered 2017-11-29: 1.2 g via TOPICAL

## 2017-11-29 MED ORDER — ONDANSETRON HCL 4 MG PO TABS: 4.0000 mg | ORAL_TABLET | Freq: Four times a day (QID) | ORAL | Status: DC | PRN

## 2017-11-29 MED ORDER — MORPHINE SULFATE (PF) 2 MG/ML IV SOLN
2.0000 mg | INTRAVENOUS | Status: DC | PRN
  Administered 2017-11-29: 2 mg via INTRAVENOUS
  Filled 2017-11-29: qty 1

## 2017-11-29 MED ORDER — DEXMEDETOMIDINE HCL 200 MCG/2ML IV SOLN
INTRAVENOUS | Status: DC | PRN
  Administered 2017-11-29: 12 ug via INTRAVENOUS
  Administered 2017-11-29: 20 ug via INTRAVENOUS

## 2017-11-29 MED ORDER — ACETAMINOPHEN 325 MG PO TABS: 650.0000 mg | ORAL_TABLET | Freq: Four times a day (QID) | ORAL | Status: DC | PRN

## 2017-11-29 MED ORDER — LACTATED RINGERS IV SOLN
INTRAVENOUS | Status: DC
  Administered 2017-11-29 (×3): via INTRAVENOUS

## 2017-11-29 MED ORDER — ONDANSETRON HCL 4 MG/2ML IJ SOLN: 4.0000 mg | Freq: Four times a day (QID) | INTRAMUSCULAR | Status: DC | PRN

## 2017-11-29 MED ORDER — KETOROLAC TROMETHAMINE 15 MG/ML IJ SOLN
15.0000 mg | Freq: Four times a day (QID) | INTRAMUSCULAR | Status: DC
  Administered 2017-11-30 (×2): 15 mg via INTRAVENOUS
  Filled 2017-11-29 (×2): qty 1

## 2017-11-29 MED ORDER — DEXAMETHASONE SODIUM PHOSPHATE 10 MG/ML IJ SOLN
INTRAMUSCULAR | Status: DC | PRN
  Administered 2017-11-29: 5 mg via INTRAVENOUS

## 2017-11-29 MED ORDER — OXYCODONE HCL 5 MG PO TABS: 5.0000 mg | ORAL_TABLET | Freq: Once | ORAL | Status: DC | PRN

## 2017-11-29 MED ORDER — FENTANYL CITRATE (PF) 250 MCG/5ML IJ SOLN
INTRAMUSCULAR | Status: AC
  Filled 2017-11-29: qty 5

## 2017-11-29 MED ORDER — DOCUSATE SODIUM 100 MG PO CAPS
100.0000 mg | ORAL_CAPSULE | Freq: Two times a day (BID) | ORAL | Status: DC
  Administered 2017-11-29 – 2017-11-30 (×2): 100 mg via ORAL
  Filled 2017-11-29 (×2): qty 1

## 2017-11-29 MED ORDER — CEFAZOLIN SODIUM-DEXTROSE 2-4 GM/100ML-% IV SOLN
INTRAVENOUS | Status: AC
  Filled 2017-11-29: qty 100

## 2017-11-29 MED ORDER — FENTANYL CITRATE (PF) 100 MCG/2ML IJ SOLN
INTRAMUSCULAR | Status: AC
  Filled 2017-11-29: qty 2

## 2017-11-29 MED ORDER — METHOCARBAMOL 500 MG PO TABS
500.0000 mg | ORAL_TABLET | Freq: Four times a day (QID) | ORAL | Status: DC | PRN
  Administered 2017-11-29: 500 mg via ORAL
  Filled 2017-11-29: qty 1

## 2017-11-29 MED ORDER — LIDOCAINE 2% (20 MG/ML) 5 ML SYRINGE
INTRAMUSCULAR | Status: DC | PRN
  Administered 2017-11-29: 100 mg via INTRAVENOUS

## 2017-11-29 MED ORDER — CEFAZOLIN SODIUM-DEXTROSE 2-4 GM/100ML-% IV SOLN
2.0000 g | Freq: Three times a day (TID) | INTRAVENOUS | Status: AC
  Administered 2017-11-29 – 2017-11-30 (×3): 2 g via INTRAVENOUS
  Filled 2017-11-29 (×3): qty 100

## 2017-11-29 MED ORDER — CHLORHEXIDINE GLUCONATE 4 % EX LIQD: 60.0000 mL | Freq: Once | CUTANEOUS | Status: DC

## 2017-11-29 MED ORDER — TOBRAMYCIN SULFATE 1.2 G IJ SOLR
INTRAMUSCULAR | Status: AC
  Filled 2017-11-29: qty 1.2

## 2017-11-29 MED ORDER — ASPIRIN EC 325 MG PO TBEC
325.0000 mg | DELAYED_RELEASE_TABLET | Freq: Every day | ORAL | Status: DC
  Administered 2017-11-30: 325 mg via ORAL
  Filled 2017-11-29: qty 1

## 2017-11-29 MED ORDER — MIDAZOLAM HCL 2 MG/2ML IJ SOLN
1.0000 mg | Freq: Once | INTRAMUSCULAR | Status: AC
  Administered 2017-11-29: 1 mg via INTRAVENOUS

## 2017-11-29 MED ORDER — ROCURONIUM BROMIDE 10 MG/ML (PF) SYRINGE
PREFILLED_SYRINGE | INTRAVENOUS | Status: DC | PRN
  Administered 2017-11-29 (×2): 50 mg via INTRAVENOUS

## 2017-11-29 MED ORDER — OXYCODONE HCL 5 MG/5ML PO SOLN: 5.0000 mg | Freq: Once | ORAL | Status: DC | PRN

## 2017-11-29 MED ORDER — 0.9 % SODIUM CHLORIDE (POUR BTL) OPTIME
TOPICAL | Status: DC | PRN
  Administered 2017-11-29: 1000 mL

## 2017-11-29 MED ORDER — PROMETHAZINE HCL 25 MG/ML IJ SOLN: 6.2500 mg | INTRAMUSCULAR | Status: DC | PRN

## 2017-11-29 MED ORDER — FENTANYL CITRATE (PF) 100 MCG/2ML IJ SOLN
25.0000 ug | INTRAMUSCULAR | Status: DC | PRN
  Administered 2017-11-29 (×3): 50 ug via INTRAVENOUS

## 2017-11-29 SURGICAL SUPPLY — 72 items
BANDAGE ACE 4X5 VEL STRL LF (GAUZE/BANDAGES/DRESSINGS) IMPLANT
BANDAGE ACE 6X5 VEL STRL LF (GAUZE/BANDAGES/DRESSINGS) IMPLANT
BANDAGE ELASTIC 4 VELCRO ST LF (GAUZE/BANDAGES/DRESSINGS) ×3 IMPLANT
BANDAGE ELASTIC 6 VELCRO ST LF (GAUZE/BANDAGES/DRESSINGS) ×3 IMPLANT
BANDAGE ESMARK 6X9 LF (GAUZE/BANDAGES/DRESSINGS) IMPLANT
BIT DRILL 2.5 X LONG (BIT) ×1
BIT DRILL LCP QC 2X140 (BIT) ×3 IMPLANT
BIT DRILL X LONG 2.5 (BIT) ×1 IMPLANT
BLADE CLIPPER SURG (BLADE) ×3 IMPLANT
BNDG COHESIVE 4X5 TAN STRL (GAUZE/BANDAGES/DRESSINGS) ×3 IMPLANT
BNDG ESMARK 6X9 LF (GAUZE/BANDAGES/DRESSINGS)
BNDG GAUZE ELAST 4 BULKY (GAUZE/BANDAGES/DRESSINGS) IMPLANT
BRUSH SCRUB SURG 4.25 DISP (MISCELLANEOUS) ×6 IMPLANT
CHLORAPREP W/TINT 26ML (MISCELLANEOUS) ×6 IMPLANT
COVER MAYO STAND STRL (DRAPES) ×3 IMPLANT
COVER WAND RF STERILE (DRAPES) ×3 IMPLANT
DRAPE C-ARM 42X72 X-RAY (DRAPES) ×3 IMPLANT
DRAPE C-ARMOR (DRAPES) ×3 IMPLANT
DRAPE HALF SHEET 40X57 (DRAPES) ×6 IMPLANT
DRAPE INCISE IOBAN 66X45 STRL (DRAPES) ×3 IMPLANT
DRAPE U-SHAPE 47X51 STRL (DRAPES) ×3 IMPLANT
DRILL BIT X LONG 2.5 (BIT) ×2
DRSG ADAPTIC 3X8 NADH LF (GAUZE/BANDAGES/DRESSINGS) ×3 IMPLANT
DRSG PAD ABDOMINAL 8X10 ST (GAUZE/BANDAGES/DRESSINGS) IMPLANT
ELECT REM PT RETURN 9FT ADLT (ELECTROSURGICAL) ×3
ELECTRODE REM PT RTRN 9FT ADLT (ELECTROSURGICAL) ×1 IMPLANT
GAUZE SPONGE 4X4 12PLY STRL (GAUZE/BANDAGES/DRESSINGS) ×6 IMPLANT
GLOVE BIO SURGEON STRL SZ7.5 (GLOVE) ×18 IMPLANT
GLOVE BIOGEL PI IND STRL 7.5 (GLOVE) ×2 IMPLANT
GLOVE BIOGEL PI INDICATOR 7.5 (GLOVE) ×4
GLOVE PROGUARD SZ 7 1/2 (GLOVE) IMPLANT
GOWN STRL REUS W/ TWL LRG LVL3 (GOWN DISPOSABLE) ×2 IMPLANT
GOWN STRL REUS W/TWL LRG LVL3 (GOWN DISPOSABLE) ×4
KIT BASIN OR (CUSTOM PROCEDURE TRAY) ×3 IMPLANT
KIT TURNOVER KIT B (KITS) ×3 IMPLANT
MANIFOLD NEPTUNE II (INSTRUMENTS) ×3 IMPLANT
NS IRRIG 1000ML POUR BTL (IV SOLUTION) ×3 IMPLANT
PACK TOTAL JOINT (CUSTOM PROCEDURE TRAY) ×3 IMPLANT
PAD ABD 8X10 STRL (GAUZE/BANDAGES/DRESSINGS) ×3 IMPLANT
PAD ARMBOARD 7.5X6 YLW CONV (MISCELLANEOUS) ×6 IMPLANT
PAD CAST 4YDX4 CTTN HI CHSV (CAST SUPPLIES) ×1 IMPLANT
PADDING CAST COTTON 4X4 STRL (CAST SUPPLIES) ×2
PADDING CAST COTTON 6X4 STRL (CAST SUPPLIES) ×3 IMPLANT
PLATE COMP VA-LCP 172 10H (Plate) ×3 IMPLANT
SCREW BONE CORTEX 3.5 46MM (Screw) ×3 IMPLANT
SCREW CORTEX LOW PRO 3.5X32 (Screw) ×6 IMPLANT
SCREW CORTEX LP 3.5X34MM (Screw) ×3 IMPLANT
SCREW CORTEX PROFILE 3.5X40 (Screw) ×3 IMPLANT
SCREW HEADED ST 3.5X80 (Screw) ×6 IMPLANT
SCREW LOCKING VA 2.7X50MM (Screw) ×3 IMPLANT
SCREW STARDRV ST VA 2.7X52 (Screw) ×3 IMPLANT
SPLINT PLASTER CAST XFAST 5X30 (CAST SUPPLIES) ×1 IMPLANT
SPLINT PLASTER XFAST SET 5X30 (CAST SUPPLIES) ×2
SPONGE LAP 18X18 X RAY DECT (DISPOSABLE) IMPLANT
STAPLER VISISTAT 35W (STAPLE) ×3 IMPLANT
STOCKINETTE IMPERVIOUS LG (DRAPES) ×3 IMPLANT
SUCTION FRAZIER HANDLE 10FR (MISCELLANEOUS) ×2
SUCTION TUBE FRAZIER 10FR DISP (MISCELLANEOUS) ×1 IMPLANT
SUT ETHILON 3 0 PS 1 (SUTURE) ×9 IMPLANT
SUT MNCRL AB 3-0 PS2 18 (SUTURE) ×3 IMPLANT
SUT PROLENE 0 CT (SUTURE) IMPLANT
SUT VIC AB 0 CT1 27 (SUTURE) ×4
SUT VIC AB 0 CT1 27XBRD ANBCTR (SUTURE) ×2 IMPLANT
SUT VIC AB 1 CT1 27 (SUTURE) ×4
SUT VIC AB 1 CT1 27XBRD ANBCTR (SUTURE) ×2 IMPLANT
SUT VIC AB 2-0 CT1 27 (SUTURE) ×10
SUT VIC AB 2-0 CT1 TAPERPNT 27 (SUTURE) ×5 IMPLANT
TOWEL OR 17X24 6PK STRL BLUE (TOWEL DISPOSABLE) ×3 IMPLANT
TOWEL OR 17X26 10 PK STRL BLUE (TOWEL DISPOSABLE) ×3 IMPLANT
TUBE CONNECTING 12'X1/4 (SUCTIONS) ×1
TUBE CONNECTING 12X1/4 (SUCTIONS) ×2 IMPLANT
YANKAUER SUCT BULB TIP NO VENT (SUCTIONS) ×3 IMPLANT

## 2017-11-29 NOTE — Interval H&P Note (Signed)
History and Physical Interval Note:  11/29/2017 10:16 AM  Bruce Benson  has presented today for surgery, with the diagnosis of Right open pilon fracture  The various methods of treatment have been discussed with the patient and family. After consideration of risks, benefits and other options for treatment, the patient has consented to  Procedure(s): OPEN REDUCTION INTERNAL FIXATION (ORIF) RIGHT PILON FRACTURE AND REMOVAL OF EXTERNAL FIXATOR (Right) as a surgical intervention .  The patient's history has been reviewed, patient examined, no change in status, stable for surgery.  I have reviewed the patient's chart and labs.  Questions were answered to the patient's satisfaction.     Caryn Bee P Kattia Selley

## 2017-11-29 NOTE — Plan of Care (Signed)

## 2017-11-29 NOTE — Anesthesia Procedure Notes (Addendum)
Anesthesia Regional Block: Adductor canal block   Pre-Anesthetic Checklist: ,, timeout performed, Correct Patient, Correct Site, Correct Laterality, Correct Procedure, Correct Position, site marked, Risks and benefits discussed, pre-op evaluation,  At surgeon's request and post-op pain management  Laterality: Right  Prep: Maximum Sterile Barrier Precautions used, chloraprep       Needles:  Injection technique: Single-shot  Needle Type: Echogenic Stimulator Needle     Needle Length: 9cm  Needle Gauge: 21     Additional Needles:   Procedures:,,,, ultrasound used (permanent image in chart),,,,  Narrative:  Start time: 11/29/2017 2:50 PM End time: 11/29/2017 3:00 PM Injection made incrementally with aspirations every 5 mL.  Performed by: Personally  Anesthesiologist: Kipp Brood, MD  Additional Notes: 20 cc 0.75 Ropivacaine injected easily

## 2017-11-29 NOTE — Plan of Care (Signed)

## 2017-11-29 NOTE — Transfer of Care (Signed)
Immediate Anesthesia Transfer of Care Note  Patient: Bruce Benson  Procedure(s) Performed: OPEN REDUCTION INTERNAL FIXATION (ORIF) RIGHT PILON FRACTURE AND REMOVAL OF EXTERNAL FIXATOR (Right Leg Lower)  Patient Location: PACU  Anesthesia Type:General  Level of Consciousness: awake, alert  and oriented  Airway & Oxygen Therapy: Patient Spontanous Breathing and Patient connected to nasal cannula oxygen  Post-op Assessment: Report given to RN, Post -op Vital signs reviewed and stable and Patient moving all extremities X 4  Post vital signs: Reviewed and stable  Last Vitals:  Vitals Value Taken Time  BP 163/95 11/29/2017  1:55 PM  Temp    Pulse 53 11/29/2017  2:02 PM  Resp 9 11/29/2017  2:02 PM  SpO2 100 % 11/29/2017  2:02 PM  Vitals shown include unvalidated device data.  Last Pain:  Vitals:   11/29/17 1359  TempSrc:   PainSc: 10-Worst pain ever      Patients Stated Pain Goal: 3 (11/29/17 1359)  Complications: No apparent anesthesia complications

## 2017-11-29 NOTE — Progress Notes (Signed)
Anesthesiology Note:  Patient in PACU following ORIF of R.tibial and R. Malleolus Fractures. Complaining of severe pain unrelieved with fentanyl. Popliteal and adductor canal blocks done in PACU with good relief. See procedure notes in anesthesia record.  Bruce Benson

## 2017-11-29 NOTE — Anesthesia Procedure Notes (Signed)
Procedure Name: Intubation Date/Time: 11/29/2017 11:12 AM Performed by: Marena Chancy, CRNA Pre-anesthesia Checklist: Patient identified, Emergency Drugs available, Suction available and Patient being monitored Patient Re-evaluated:Patient Re-evaluated prior to induction Oxygen Delivery Method: Circle System Utilized Preoxygenation: Pre-oxygenation with 100% oxygen Induction Type: IV induction Ventilation: Mask ventilation without difficulty Laryngoscope Size: Miller and 3 Grade View: Grade I Tube type: Oral Tube size: 7.5 mm Number of attempts: 1 Airway Equipment and Method: Stylet and Oral airway Placement Confirmation: ETT inserted through vocal cords under direct vision,  positive ETCO2 and breath sounds checked- equal and bilateral Tube secured with: Tape Dental Injury: Teeth and Oropharynx as per pre-operative assessment

## 2017-11-29 NOTE — Anesthesia Procedure Notes (Addendum)
Anesthesia Regional Block: Popliteal block   Pre-Anesthetic Checklist: ,, timeout performed, Correct Patient, Correct Site, Correct Laterality, Correct Procedure, Correct Position, site marked, Risks and benefits discussed,  Surgical consent,  Pre-op evaluation,  At surgeon's request and post-op pain management  Laterality: Right  Prep: chloraprep       Needles:  Injection technique: Single-shot  Needle Type: Stimulator Needle - 40      Needle Gauge: 22     Additional Needles:   Procedures:, nerve stimulator,,, ultrasound used (permanent image in chart),,,,  Narrative:  Start time: 11/29/2017 2:50 PM End time: 11/29/2017 3:00 PM Injection made incrementally with aspirations every 5 mL.  Performed by: Personally  Anesthesiologist: Kipp Brood, MD  Additional Notes: 20 cc 0.5% Bupivacaine injected easily

## 2017-11-29 NOTE — Op Note (Signed)
OrthopaedicSurgeryOperativeNote ([id number]) Date of Surgery: 11/29/2017  Admit Date: 11/29/2017   Diagnoses: Pre-Op Diagnoses: Right type IIIA open pilon fracture S/P external fixation  Post-Op Diagnosis: Same  Procedures: 1. CPT 27758-Open reduction internal fixation of right tibia fracture 2. CPT 27766-Open reduction internal fixation of right medial malleolus 3. CPT 20694-Removal of external fixator of right ankle 4. CPT 11044-Debridement of external fixator pin sites  Surgeons: Primary: Jaynia Fendley, Gillie Manners, MD   Location:[facility] OR ROOM 03   AnesthesiaGeneral   Antibiotics:Ancef 2g preop   Tourniquettime: Total Tourniquet Time Documented: Thigh (Right) - 115 minutes Total: Thigh (Right) - 115 minutes  EstimatedBloodLoss:48mL  Complications:None  Specimens:None  Implants: Implant Name Type Inv. Item Serial No. Manufacturer Lot No. LRB No. Used Action  SCREW BONE CORTEX 3.5 - [id number] Screw SCREW BONE CORTEX 3.5  [employer]  Right 1 Implanted  SCREW CORTEX PROFILE 3.5X40 - [id number] Screw SCREW CORTEX PROFILE 3.5X40  [employer]  Right 1 Implanted  SCREW CORTEX LP 3.5X34MM - [id number] Screw SCREW CORTEX LP 3.5X34MM  [employer]  Right 1 Implanted  SCREW LOCKING VA 2.7X50MM - [id number] Screw SCREW LOCKING VA 2.7X50MM  [employer]  Right 1 Implanted  SCREW CORTEX LOW PRO 3.5X32 - [id number] Screw SCREW CORTEX LOW PRO 3.5X32  [employer]  Right 2 Implanted  Anteriolateral Distal Tibia 10 hole right plate Plate   [employer]  Right 1 Implanted  2.1mm x 52, Metaphyseal screw Screw   [employer]  Right 1 Implanted  SCREW HEADED ST 3.5X80 - [id number] Screw SCREW HEADED ST 3.5X80  [employer]  Right 2 Implanted    IndicationsforSurgery: 22 year old male who sustained a type III a open peel on fracture that underwent I&D and external fixation initially.  I took him back for repeat irrigation debridement with percutaneous  fixation of his pilon fracture with adjustment of his external fixator.  He was too swollen to proceed with the definitive fixation so he was discharged and followed up this week in clinic.  His skin was more appropriate for incision for a anterior lateral approach.  I discussed risks and benefits to proceeding with fixation of his tibia fracture. Risks discussed included bleeding requiring blood transfusion, bleeding causing a hematoma, infection, malunion, nonunion, damage to surrounding nerves and blood vessels, pain, hardware prominence or irritation, hardware failure, stiffness, post-traumatic arthritis, DVT/PE, compartment syndrome, and even death.  The patient agreed to proceed with surgery and consent was obtained.  Operative Findings: 1. ORIF of right tibia/pilon using [employer] 10-hole anterolateral LCP distal tibial plate 2. Percutaneous fixation of right medial malleolus fracture 3. Removal of external fixator and debridement of external fixator pin sites.  Procedure: The patient was identified in the preoperative holding area. Consent was confirmed with the patient and their family and all questions were answered. The operative extremity was marked after confirmation with the patient. he was then brought back to the operating room by our anesthesia colleagues.  He was transferred over to a radiolucent flat top table.  He was placed under general anesthetic.  A bump was placed under his operative hip.  The external fixator was then removed. The operative extremity was then prepped and draped in usual sterile fashion. A preoperative timeout was performed to verify the patient, the procedure, and the extremity. Preoperative antibiotics were dosed. I covered the external fixator pin sites with ioban to prevent contamination with my incision.  Fluoroscopic images were obtained to evaluate the fracture.  I then extended my percutaneous  incision into a full anterior lateral approach.  Carried this down  through skin and subcutaneous tissues.  Identified the superficial peroneal nerve branch and protected this throughout the portion of the procedure.  I then incised through the extensor retinaculum and mobilized the extensor tendons anteriorly and proceeded to carefully dissect underneath these to the medial side of the distal tibia.  I then used a Cobb elevator to create a path for my anterior lateral plate.  I then chose a 10 hole 3.5 mm LCP anterior lateral distal tibial locking plate.  The plate was contoured to appropriately fit the distal tibia.  I then slid submuscularly under the anterior tibialis musculature.  I fitted appropriate distally and confirmed adequate placement with anterior and lateral fluoroscopic images.  This point I then placed a nonlocking 3.5 millimeter screw in the distal segment to hold this portion of the plate to the distal bone.  I then made a percutaneous incision and I used a 3.5 mm nonlocking screw to bring the plate flush to the tibial shaft.  I confirmed that my alignment was appropriate without any alteration of my varus valgus or my anterior posterior alignment.  Once I was pleased with the alignment of my fixation I then proceeded to place 2 more nonlocking screws into the shaft.  I then placed a 2.7 mm nonlocking screw into the distal portion of the plate to bring it flush to bone.  I then placed 2 more locking screws distally.  I then stressed the construct and there is still some movement distally at the medial malleolus as result I felt that this should be fixed.  I tenaculum was used to reduce the medial malleolus and then 3.5 mm nonlocking screws were placed percutaneously to hold the medial malleolus in place.  Final fluoroscopic images were obtained.  The incisions were copiously irrigated.  A gram of vancomycin powder 1.2 g of tobramycin powder were placed into the incision.  I then closed the retinaculum with 0 Vicryl.  The skin was closed with 2-0 Vicryl and  3-0 nylon.  Percutaneous incisions were closed with 3-0 nylon.  I then removed the Ioban that was covering the ex-fix pin sites and the proceeded to debride them with a curette.  After thorough debridement I then irrigated these and placed some vancomycin powder into the incisions.  The all the incisions were then dressed with bacitracin ointment, Adaptic, 4 x 4's sterile cast padding and a well-padded short leg splint was placed.  The patient was awoken from anesthesia and taken to PACU in stable condition.  Post Op Plan/Instructions: The patient will be nonweightbearing to the right lower extremity.  He will receive postoperative Ancef.  He will be on aspirin for DVT prophylaxis.  He will be admitted for overnight for pain control.  He will likely discharge postoperative day 1.  I was present and performed the entire surgery.  Truitt Merle, MD [facility]
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
## 2017-11-30 NOTE — Plan of Care (Signed)

## 2017-11-30 NOTE — Discharge Instructions (Signed)
Cast or Splint Care, Adult Casts and splints are supports that are worn to protect broken bones and other injuries. A cast or splint may hold a bone still and in the correct position while it heals. Casts and splints may also help to ease pain, swelling, and muscle spasms. How to care for your cast  Do not stick anything inside the cast to scratch your skin.  Check the skin around the cast every day. Tell your doctor about any concerns.  You may put lotion on dry skin around the edges of the cast. Do not put lotion on the skin under the cast.  Keep the cast clean.  If the cast is not waterproof: ? Do not let it get wet. ? Cover it with a watertight covering when you take a bath or a shower. How to care for your splint  Wear it as told by your doctor. Take it off only as told by your doctor.  Loosen the splint if your fingers or toes tingle, get numb, or turn cold and blue.  Keep the splint clean.  If the splint is not waterproof: ? Do not let it get wet. ? Cover it with a watertight covering when you take a bath or a shower. Follow these instructions at home: Bathing  Do not take baths or swim until your doctor says it is okay. Ask your doctor if you can take showers. You may only be allowed to take sponge baths for bathing.  If your cast or splint is not waterproof, cover it with a watertight covering when you take a bath or shower. Managing pain, stiffness, and swelling  Move your fingers or toes often to avoid stiffness and to lessen swelling.  Raise (elevate) the injured area above the level of your heart while sitting or lying down. Safety  Do not use the injured limb to support your body weight until your doctor says that it is okay.  Use crutches or other assistive devices as told by your doctor. General instructions  Do not put pressure on any part of the cast or splint until it is fully hardened. This may take many hours.  Return to your normal activities as  told by your doctor. Ask your doctor what activities are safe for you.  Keep all follow-up visits as told by your doctor. This is important. Contact a doctor if:  Your cast or splint gets damaged.  The skin around the cast gets red or raw.  The skin under the cast is very itchy or painful.  Your cast or splint feels very uncomfortable.  Your cast or splint is too tight or too loose.  Your cast becomes wet or it starts to have a soft spot or area.  You get an object stuck under your cast. Get help right away if:  Your pain gets worse.  The injured area tingles, gets numb, or turns blue and cold.  The part of your body above or below the cast is swollen and it turns a different color (is discolored).  You cannot feel or move your fingers or toes.  There is fluid leaking through the cast.  You have very bad pain or pressure under the cast.  You have trouble breathing.  You have shortness of breath.  You have chest pain. This information is not intended to replace advice given to you by your health care provider. Make sure you discuss any questions you have with your health care provider. Document Released: 05/31/2010 Document   Revised: 01/20/2016 Document Reviewed: 01/20/2016 Elsevier Interactive Patient Education  2017 [employer].   Call your doctor for any signs of infection including temp greater than 101, any increased swelling, redness, or drainage, or for pain not controlled by your pain medication.   No driving until cleared by your doctor and definitely not while taking pain medication. Call your doctor for any further questions or concerns.

## 2017-11-30 NOTE — Progress Notes (Addendum)
Patient ID: Bruce Benson, male   DOB: April 05, 1995, 22 y.o.   MRN: [id number]     Subjective:  Patient reports pain as mild.  Patient in the bed and ready to go  home  Objective:   VITALS:   Vitals:   11/29/17 2108 11/30/17 0027 11/30/17 0744 11/30/17 1148  BP: 120/77 116/88 114/73 122/90  Pulse: 63 64 99 81  Resp: 18 18 16 16   Temp: 98.2 F (36.8 C) 98.9 F (37.2 C) 98.7 F (37.1 C) 98.6 F (37 C)  TempSrc: Oral Oral Oral Oral  SpO2: 100% 99% 100% 100%  Weight:      Height:        ABD soft Sensation intact distally Dorsiflexion/Plantar flexion intact Incision: dressing C/D/I and no drainage   Lab Results  Component Value Date   WBC 9.9 11/18/2017   HGB 12.9 (L) 11/29/2017   HCT 39.7 11/18/2017   MCV 89.6 11/18/2017   PLT 164 11/18/2017   BMET    Component Value Date/Time   NA 135 11/29/2017 0920   NA 141 10/18/2011 0636   K 3.7 11/29/2017 0920   K 3.8 10/18/2011 0636   CL 101 11/29/2017 0920   CL 105 10/18/2011 0636   CO2 24 11/29/2017 0920   CO2 29 (H) 10/18/2011 0636   GLUCOSE 88 11/29/2017 0920   GLUCOSE 91 10/18/2011 0636   BUN 21 (H) 11/29/2017 0920   BUN 9 10/18/2011 0636   CREATININE 1.36 (H) 11/29/2017 0920   CREATININE 1.10 10/18/2011 0636   CALCIUM 9.5 11/29/2017 0920   CALCIUM 9.6 10/18/2011 0636   GFRNONAA >60 11/29/2017 0920   GFRAA >60 11/29/2017 0920     Assessment/Plan: 1 Day Post-Op   Principal Problem:   Displaced pilon fracture of right tibia, initial encounter for open fracture type I or II Active Problems:   Open pilon fracture, right, type III, initial encounter   Advance diet Up with therapy DC home follow up with Dr Jena Gauss NWB right lower ext Splint at all times    Haskel Khan 11/30/2017, 12:19 PM  Discussed and agree with above.   Teryl Lucy, MD Cell [phone]

## 2017-11-30 NOTE — Plan of Care (Signed)
  Problem: Education: Goal: Knowledge of General Education information will improve Description Including pain rating scale, medication(s)/side effects and non-pharmacologic comfort measures 11/30/2017 1301 by Darreld Mclean, RN Outcome: Adequate for Discharge 11/30/2017 1042 by Darreld Mclean, RN Outcome: Progressing   Problem: Health Behavior/Discharge Planning: Goal: Ability to manage health-related needs will improve 11/30/2017 1301 by Darreld Mclean, RN Outcome: Adequate for Discharge 11/30/2017 1042 by Darreld Mclean, RN Outcome: Progressing   Problem: Clinical Measurements: Goal: Ability to maintain clinical measurements within normal limits will improve 11/30/2017 1301 by Darreld Mclean, RN Outcome: Adequate for Discharge 11/30/2017 1042 by Darreld Mclean, RN Outcome: Progressing Goal: Will remain free from infection 11/30/2017 1301 by Darreld Mclean, RN Outcome: Adequate for Discharge 11/30/2017 1042 by Darreld Mclean, RN Outcome: Progressing Goal: Diagnostic test results will improve 11/30/2017 1301 by Darreld Mclean, RN Outcome: Adequate for Discharge 11/30/2017 1042 by Darreld Mclean, RN Outcome: Progressing Goal: Respiratory complications will improve 11/30/2017 1301 by Darreld Mclean, RN Outcome: Adequate for Discharge 11/30/2017 1042 by Darreld Mclean, RN Outcome: Progressing Goal: Cardiovascular complication will be avoided 11/30/2017 1301 by Darreld Mclean, RN Outcome: Adequate for Discharge 11/30/2017 1042 by Darreld Mclean, RN Outcome: Progressing   Problem: Activity: Goal: Risk for activity intolerance will decrease 11/30/2017 1301 by Darreld Mclean, RN Outcome: Adequate for Discharge 11/30/2017 1042 by Darreld Mclean, RN Outcome: Progressing   Problem: Nutrition: Goal: Adequate nutrition will be maintained 11/30/2017 1301 by Darreld Mclean, RN Outcome: Adequate for Discharge 11/30/2017 1042 by Darreld Mclean, RN Outcome: Progressing   Problem: Coping: Goal: Level of anxiety will decrease 11/30/2017 1301 by Darreld Mclean,  RN Outcome: Adequate for Discharge 11/30/2017 1042 by Darreld Mclean, RN Outcome: Progressing   Problem: Elimination: Goal: Will not experience complications related to bowel motility 11/30/2017 1301 by Darreld Mclean, RN Outcome: Adequate for Discharge 11/30/2017 1042 by Darreld Mclean, RN Outcome: Progressing Goal: Will not experience complications related to urinary retention 11/30/2017 1301 by Darreld Mclean, RN Outcome: Adequate for Discharge 11/30/2017 1042 by Darreld Mclean, RN Outcome: Progressing   Problem: Pain Managment: Goal: General experience of comfort will improve 11/30/2017 1301 by Darreld Mclean, RN Outcome: Adequate for Discharge 11/30/2017 1042 by Darreld Mclean, RN Outcome: Progressing   Problem: Safety: Goal: Ability to remain free from injury will improve 11/30/2017 1301 by Darreld Mclean, RN Outcome: Adequate for Discharge 11/30/2017 1042 by Darreld Mclean, RN Outcome: Progressing   Problem: Skin Integrity: Goal: Risk for impaired skin integrity will decrease 11/30/2017 1301 by Darreld Mclean, RN Outcome: Adequate for Discharge 11/30/2017 1042 by Darreld Mclean, RN Outcome: Progressing

## 2017-12-01 NOTE — Anesthesia Postprocedure Evaluation (Signed)
Anesthesia Post Note  Patient: Bruce Benson  Procedure(s) Performed: OPEN REDUCTION INTERNAL FIXATION (ORIF) RIGHT PILON FRACTURE AND REMOVAL OF EXTERNAL FIXATOR (Right Leg Lower)     Anesthesia Post Evaluation  Last Vitals:  Vitals:   11/30/17 0744 11/30/17 1148  BP: 114/73 122/90  Pulse: 99 81  Resp: 16 16  Temp: 37.1 C 37 C  SpO2: 100% 100%    Last Pain:  Vitals:   11/30/17 1148  TempSrc: Oral  PainSc:                  Kaylyn Layer

## 2017-12-03 ENCOUNTER — Encounter (HOSPITAL_COMMUNITY): Payer: Self-pay | Admitting: Student

## 2017-12-15 ENCOUNTER — Encounter: Payer: Self-pay | Admitting: Student

## 2017-12-15 DIAGNOSIS — S82251C Displaced comminuted fracture of shaft of right tibia, initial encounter for open fracture type IIIA, IIIB, or IIIC: Secondary | ICD-10-CM | POA: Insufficient documentation

## 2017-12-15 HISTORY — DX: Displaced comminuted fracture of shaft of right tibia, initial encounter for open fracture type IIIA, IIIB, or IIIC: S82.251C

## 2018-09-24 ENCOUNTER — Other Ambulatory Visit: Payer: Self-pay

## 2018-09-24 ENCOUNTER — Emergency Department
Admission: EM | Admit: 2018-09-24 | Discharge: 2018-09-24 | Disposition: A | Discharge status: Home / Self Care | Payer: Self-pay | Attending: Emergency Medicine | Admitting: Emergency Medicine

## 2018-09-24 ENCOUNTER — Encounter: Payer: Self-pay | Admitting: *Deleted

## 2018-09-24 DIAGNOSIS — Z202 Contact with and (suspected) exposure to infections with a predominantly sexual mode of transmission: Secondary | ICD-10-CM | POA: Insufficient documentation

## 2018-09-24 LAB — URINALYSIS, COMPLETE (UACMP) WITH MICROSCOPIC
Bacteria, UA: NONE SEEN
Bilirubin Urine: NEGATIVE
Glucose, UA: NEGATIVE mg/dL
Hgb urine dipstick: NEGATIVE
Ketones, ur: NEGATIVE mg/dL
Nitrite: NEGATIVE
Protein, ur: NEGATIVE mg/dL
Specific Gravity, Urine: 1.018 (ref 1.005–1.030)
Squamous Epithelial / HPF: NONE SEEN (ref 0–5)
WBC, UA: >50 WBC/hpf — ABNORMAL HIGH (ref 0–5)
pH: 6 (ref 5.0–8.0)

## 2018-09-24 LAB — CHLAMYDIA/NGC RT PCR (ARMC ONLY)
Chlamydia Tr: NOT DETECTED
N gonorrhoeae: DETECTED — AB

## 2018-09-24 MED ORDER — AZITHROMYCIN 500 MG PO TABS
1000.0000 mg | ORAL_TABLET | Freq: Once | ORAL | Status: AC
  Administered 2018-09-24: 1000 mg via ORAL
  Filled 2018-09-24: qty 2

## 2018-09-24 MED ORDER — CEFTRIAXONE SODIUM 250 MG IJ SOLR
250.0000 mg | Freq: Once | INTRAMUSCULAR | Status: AC
  Administered 2018-09-24: 20:00:00 250 mg via INTRAMUSCULAR
  Filled 2018-09-24: qty 250

## 2018-09-24 MED ORDER — METRONIDAZOLE 500 MG PO TABS
2000.0000 mg | ORAL_TABLET | Freq: Once | ORAL | Status: AC
  Administered 2018-09-24: 2000 mg via ORAL
  Filled 2018-09-24: qty 4
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
## 2018-09-24 NOTE — ED Triage Notes (Signed)
Pt has penile discharge for 2 days.  Denies urinary sx. Pt alert

## 2018-09-25 ENCOUNTER — Telehealth: Payer: Self-pay | Admitting: Emergency Medicine

## 2018-09-25 NOTE — Telephone Encounter (Signed)
Called patient to inform of positive std test. He was treated during ED visit.  The voicemail gave a different name, so I did not leave a message. Will send letter.

## 2018-09-29 ENCOUNTER — Ambulatory Visit: Payer: Self-pay

## 2018-11-14 ENCOUNTER — Ambulatory Visit: Payer: Self-pay | Admitting: Family Medicine

## 2018-11-14 ENCOUNTER — Other Ambulatory Visit: Payer: Self-pay

## 2018-11-14 ENCOUNTER — Encounter: Payer: Self-pay | Admitting: Family Medicine

## 2018-11-14 DIAGNOSIS — Z113 Encounter for screening for infections with a predominantly sexual mode of transmission: Secondary | ICD-10-CM

## 2018-11-14 DIAGNOSIS — Z202 Contact with and (suspected) exposure to infections with a predominantly sexual mode of transmission: Secondary | ICD-10-CM

## 2018-11-14 LAB — GRAM STAIN

## 2018-11-14 MED ORDER — AZITHROMYCIN 500 MG PO TABS: 1000.0000 mg | ORAL_TABLET | Freq: Once | ORAL | 0 refills | Status: AC

## 2018-11-14 MED ORDER — CEFTRIAXONE SODIUM 250 MG IJ SOLR
250.0000 mg | Freq: Once | INTRAMUSCULAR | Status: AC
  Administered 2018-11-14: 16:00:00 250 mg via INTRAMUSCULAR

## 2018-11-14 NOTE — Addendum Note (Signed)
Addended by: Debera Lat on: 11/14/2018 04:22 PM   Modules accepted: Orders

## 2018-11-14 NOTE — Progress Notes (Signed)
In due to partner treated this week for +GC; desires screening Debera Lat, RN
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
## 2018-11-18 LAB — GC/CHLAMYDIA PROBE AMP
Chlamydia trachomatis, NAA: NEGATIVE
Neisseria Gonorrhoeae by PCR: NEGATIVE

## 2018-11-20 LAB — HEPATITIS B SURFACE ANTIGEN: Hepatitis B Surface Ag: NONREACTIVE

## 2018-11-26 LAB — HM HEPATITIS C SCREENING LAB: HM Hepatitis Screen: NEGATIVE

## 2018-11-26 LAB — HM HIV SCREENING LAB: HM HIV Screening: NEGATIVE

## 2018-12-18 ENCOUNTER — Emergency Department: Admission: EM | Admit: 2018-12-18 | Discharge: 2018-12-18 | Discharge status: Against Medical Advice | Payer: Self-pay

## 2018-12-18 NOTE — ED Notes (Signed)
No answer when called several times from lobby 

## 2018-12-18 NOTE — ED Notes (Signed)
Called several times from lobby with no answer 

## 2019-02-20 ENCOUNTER — Other Ambulatory Visit: Payer: Self-pay

## 2019-02-20 ENCOUNTER — Encounter: Payer: Self-pay | Admitting: Physician Assistant

## 2019-02-20 ENCOUNTER — Ambulatory Visit: Payer: Self-pay | Admitting: Physician Assistant

## 2019-02-20 DIAGNOSIS — Z113 Encounter for screening for infections with a predominantly sexual mode of transmission: Secondary | ICD-10-CM

## 2019-02-20 LAB — GRAM STAIN

## 2019-02-20 NOTE — Progress Notes (Signed)
Here today for STD screening. Accepts bloodwork. Ginny Loomer, RN ° °

## 2019-02-23 NOTE — Progress Notes (Signed)
   [facility] Department STI clinic/screening visit  Subjective:  Bruce Benson is a 24 y.o. male being seen today for an STI screening visit. The patient reports they do not have symptoms.    Patient has the following medical conditions:   Patient Active Problem List   Diagnosis Date Noted  . Type III open displaced comminuted fracture of shaft of right tibia 12/15/2017  . Open pilon fracture, right, type III, initial encounter 11/29/2017     Chief Complaint  Patient presents with  . SEXUALLY TRANSMITTED DISEASE    HPI  Patient reports that he is not having any symptoms.  Desires a screening today.   See flowsheet for further details and programmatic requirements.    The following portions of the patient's history were reviewed and updated as appropriate: allergies, current medications, past medical history, past social history, past surgical history and problem list.  Objective:  There were no vitals filed for this visit.  Physical Exam Constitutional:      General: He is not in acute distress.    Appearance: Normal appearance. He is normal weight.  HENT:     Head: Normocephalic and atraumatic.     Comments: No nits, lice, or hair loss. No cervical, supraclavicular or axillary adenopathy.    Mouth/Throat:     Mouth: Mucous membranes are moist.     Pharynx: Oropharynx is clear. No oropharyngeal exudate or posterior oropharyngeal erythema.  Eyes:     Conjunctiva/sclera: Conjunctivae normal.  Pulmonary:     Effort: Pulmonary effort is normal.  Abdominal:     Palpations: Abdomen is soft. There is no mass.     Tenderness: There is no abdominal tenderness. There is no guarding or rebound.  Genitourinary:    Penis: Normal.      Testes: Normal.     Comments: Pubic area without nits, lice, edema, erythema, lesions and inguinal adenopathy. Penis circumcised and without discharge at meatus, lesions or rashes. Musculoskeletal:     Cervical back: Neck  supple. No tenderness.  Skin:    General: Skin is warm and dry.     Findings: No bruising, erythema, lesion or rash.  Neurological:     Mental Status: He is alert and oriented to person, place, and time.  Psychiatric:        Mood and Affect: Mood normal.        Behavior: Behavior normal.        Thought Content: Thought content normal.        Judgment: Judgment normal.       Assessment and Plan:  ZAHEER WAGEMAN is a 24 y.o. male presenting to the [facility] Department for STI screening  1. Screening for STD (sexually transmitted disease) Patient into clinic without symptoms. Rec condoms with all sex. Await test results.  Counseled that RN will call if needs to RTC for treatment once results are back. - Gram stain - HIV [place] [facility] - Syphilis Serology, [place] [facility] - Gonococcus culture     No follow-ups on file.  No future appointments.  Matt Holmes, PA

## 2019-02-25 LAB — GONOCOCCUS CULTURE

## 2020-07-12 ENCOUNTER — Ambulatory Visit: Payer: Self-pay

## 2021-03-02 ENCOUNTER — Encounter: Payer: Self-pay | Admitting: Emergency Medicine

## 2021-03-02 ENCOUNTER — Other Ambulatory Visit: Payer: Self-pay

## 2021-03-02 ENCOUNTER — Emergency Department
Admission: EM | Admit: 2021-03-02 | Discharge: 2021-03-02 | Disposition: A | Discharge status: Home / Self Care | Payer: Self-pay | Attending: Emergency Medicine | Admitting: Emergency Medicine

## 2021-03-02 DIAGNOSIS — H6981 Other specified disorders of Eustachian tube, right ear: Secondary | ICD-10-CM

## 2021-03-02 DIAGNOSIS — H6991 Unspecified Eustachian tube disorder, right ear: Secondary | ICD-10-CM | POA: Diagnosis not present

## 2021-03-02 DIAGNOSIS — H9201 Otalgia, right ear: Secondary | ICD-10-CM | POA: Diagnosis present

## 2021-03-02 MED ORDER — NEOMYCIN-POLYMYXIN-HC 3.5-10000-1 OT SOLN: 3.0000 [drp] | Freq: Four times a day (QID) | OTIC | 0 refills | Status: AC

## 2021-03-02 MED ORDER — PREDNISONE 10 MG PO TABS: ORAL_TABLET | ORAL | 0 refills | Status: DC

## 2021-03-02 NOTE — Discharge Instructions (Signed)
Follow-up with Dr. Sheran Spine who is on-call for [facility] if any continued problems or not improving.  Begin using the eardrop as well as the steroids until completely finished.  This should help with your eustachian tube to open it up and the eardrops should help with ear pain.  You may take Tylenol or ibuprofen as needed for pain.  Do not stick any objects in your ear which may damage your ear from.

## 2021-03-02 NOTE — ED Triage Notes (Signed)
Pt arrived via POV with reports of R ear pain that started last night.

## 2021-03-02 NOTE — ED Notes (Signed)
See triage note  presents with pain to right ear   pain started last pm  no fever
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
## 2021-03-02 NOTE — ED Notes (Signed)
This RN reviewed paperwork with pt. No further complaints or questions. Pt ambulated to lobby. Discharge form placed in med rec box.  

## 2021-03-03 ENCOUNTER — Other Ambulatory Visit: Payer: Self-pay

## 2021-03-03 ENCOUNTER — Encounter: Payer: Self-pay | Admitting: Emergency Medicine

## 2021-03-03 ENCOUNTER — Ambulatory Visit
Admission: EM | Admit: 2021-03-03 | Discharge: 2021-03-03 | Disposition: A | Discharge status: Home / Self Care | Payer: 59 | Attending: Medical Oncology | Admitting: Medical Oncology

## 2021-03-03 DIAGNOSIS — H66001 Acute suppurative otitis media without spontaneous rupture of ear drum, right ear: Secondary | ICD-10-CM | POA: Diagnosis not present

## 2021-03-03 DIAGNOSIS — H60391 Other infective otitis externa, right ear: Secondary | ICD-10-CM | POA: Diagnosis not present

## 2021-03-03 MED ORDER — KETOROLAC TROMETHAMINE 60 MG/2ML IM SOLN
60.0000 mg | Freq: Once | INTRAMUSCULAR | Status: AC
  Administered 2021-03-03: 60 mg via INTRAMUSCULAR

## 2021-03-03 MED ORDER — AMOXICILLIN-POT CLAVULANATE 875-125 MG PO TABS: 1.0000 | ORAL_TABLET | Freq: Two times a day (BID) | ORAL | 0 refills | Status: DC
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
## 2021-03-03 NOTE — Discharge Instructions (Addendum)
Please take 40 mg of your prednisone for the next 4 days then take 20 mg for the remaining days Please continue your eardrops in your right ear I am sending in an antibiotic for you to use along with your current medications.  If symptoms worsen please go back to the emergency room for further evaluation.  If symptoms improve but do not fully resolve within the next 1 to 2 weeks I want you to go see ear nose and throat specialist

## 2021-03-03 NOTE — ED Triage Notes (Signed)
Patient c/o right ear pain that started 3 days ago.  Patient states that he was seen at the ED yesterday and was given Prednisone and ear drops.  Patient states that his pain is getting worse.

## 2021-06-22 ENCOUNTER — Other Ambulatory Visit: Payer: Self-pay

## 2021-06-22 ENCOUNTER — Emergency Department
Admission: EM | Admit: 2021-06-22 | Discharge: 2021-06-22 | Disposition: A | Discharge status: Home / Self Care | Payer: 59 | Attending: Emergency Medicine | Admitting: Emergency Medicine

## 2021-06-22 ENCOUNTER — Encounter: Payer: Self-pay | Admitting: Emergency Medicine

## 2021-06-22 ENCOUNTER — Emergency Department: Payer: 59

## 2021-06-22 DIAGNOSIS — W1839XA Other fall on same level, initial encounter: Secondary | ICD-10-CM | POA: Diagnosis not present

## 2021-06-22 DIAGNOSIS — S62356A Nondisplaced fracture of shaft of fifth metacarpal bone, right hand, initial encounter for closed fracture: Secondary | ICD-10-CM | POA: Diagnosis not present

## 2021-06-22 DIAGNOSIS — S6991XA Unspecified injury of right wrist, hand and finger(s), initial encounter: Secondary | ICD-10-CM | POA: Diagnosis present

## 2021-06-22 MED ORDER — OXYCODONE HCL 5 MG PO TABS: 5.0000 mg | ORAL_TABLET | Freq: Four times a day (QID) | ORAL | 0 refills | Status: DC | PRN

## 2021-06-22 NOTE — ED Provider Notes (Signed)
? ?[facility] ?Provider Note ? ? ? Event Date/Time  ? First MD Initiated Contact with Patient 06/22/21 1237   ?  (approximate) ? ? ?History  ? ?Hand Pain ? ? ?HPI ? ?Bruce Benson is a 26 y.o. male   presents to the ED with complaint of right hand pain after patient tried to catch himself from falling.  Patient denies any other injury during this fall.  He states he has continued to have pain in his hand since that time.  Patient denies any medical history and is a non-smoker.  He rates his pain as 10/10. ? ?  ? ? ?Physical Exam  ? ?Triage Vital Signs: ?ED Triage Vitals [06/22/21 1152]  ?Enc Vitals Group  ?   BP 120/79  ?   Pulse Rate 63  ?   Resp 18  ?   Temp 97.6 ?F (36.4 ?C)  ?   Temp Source Oral  ?   SpO2 98 %  ?   Weight 170 lb (77.1 kg)  ?   Height 6\' 3"  (1.905 m)  ?   Head Circumference   ?   Peak Flow   ?   Pain Score 10  ?   Pain Loc   ?   Pain Edu?   ?   Excl. in GC?   ? ? ?Most recent vital signs: ?Vitals:  ? 06/22/21 1152  ?BP: 120/79  ?Pulse: 63  ?Resp: 18  ?Temp: 97.6 ?F (36.4 ?C)  ?SpO2: 98%  ? ? ? ?General: Awake, no distress.  ?CV:  Good peripheral perfusion.  ?Resp:  Normal effort.  ?Abd:  No distention.  ?Other:  On examination of the right hand there is marked tenderness on palpation of the fifth metacarpal and some soft tissue edema is noted.  Patient is unable to tolerate palpation very well.  He also is able to move his fingers and enough for me to know that he can flex and extend but will not make a fist due to increased pain.  Skin is intact.  Capillary refills less than 3 seconds and radial pulses present. ? ? ?ED Results / Procedures / Treatments  ? ?Labs ?(all labs ordered are listed, but only abnormal results are displayed) ?Labs Reviewed - No data to display ? ? ? ?RADIOLOGY ?Right hand x-ray interpreted by myself shows a fracture of the fifth metacarpal midline.  Slight angulation.  Official radiology report shows comminuted fracture mid fifth  metacarpal. ? ? ? ?PROCEDURES: ? ?Critical Care performed:  ? ?Procedures ?Ulnar gutter OCL splint was applied by myself and 08/22/21, paramedic. ? ?MEDICATIONS ORDERED IN ED: ?Medications - No data to display ? ? ?IMPRESSION / MDM / ASSESSMENT AND PLAN / ED COURSE  ?I reviewed the triage vital signs and the nursing notes. ? ? ?Differential diagnosis includes, but is not limited to, contusion right hand, fracture right hand, sprain right hand, sprained wrist. ? ?26 year old male presents to the ED after a fall this morning in which he tried to catch himself and landed on the dorsal aspect of his right hand.  On exam is suspicious for a fracture of the metacarpal area and x-ray confirms that patient has a comminuted fracture of the fifth metacarpal shaft.  Patient was made aware and a prescription for oxycodone 5 mg every 6 hours was sent to the pharmacy.  An ulnar gutter splint was applied to patient's hand and patient was instructed to ice and elevation to reduce  swelling and help with pain.  He was given instructions to call and make an appointment with Dr. Odis Luster who is on-call for orthopedics.  He was also given a note to remain out of work until seen by the orthopedist. ? ? ? ?  ? ? ?FINAL CLINICAL IMPRESSION(S) / ED DIAGNOSES  ? ?Final diagnoses:  ?Closed nondisplaced fracture of shaft of fifth metacarpal bone of right hand, initial encounter  ? ? ? ?Rx / DC Orders  ? ?ED Discharge Orders   ? ?      Ordered  ?  oxyCODONE (ROXICODONE) 5 MG immediate release tablet  Every 6 hours PRN       ? 06/22/21 1302  ? ?  ?  ? ?  ? ? ? ?Note:  This document was prepared using Dragon voice recognition software and may include unintentional dictation errors. ?  ?Tommi Rumps, PA-C ?06/22/21 1503 ? ?  ?Chesley Noon, MD ?06/24/21 [id number] ? ?

## 2021-06-22 NOTE — Discharge Instructions (Signed)
Call make an appointment with Dr. Odis Luster who is the orthopedist on-call.  His contact information and address are listed on your discharge papers.  Pain medication was sent to the pharmacy.  Leave the splint on until you are seen by the orthopedist for protection and support.  Apply ice and elevation to reduce swelling. ?

## 2021-06-22 NOTE — ED Triage Notes (Signed)
Pt here after a fall in which he injured his right hand trying to catch himself. Pt not able to move his fingers at this moment due to the pain. ?

## 2021-08-25 ENCOUNTER — Other Ambulatory Visit: Payer: Self-pay

## 2021-08-25 ENCOUNTER — Emergency Department
Admission: EM | Admit: 2021-08-25 | Discharge: 2021-08-25 | Disposition: A | Discharge status: Home / Self Care | Payer: 59 | Attending: Emergency Medicine | Admitting: Emergency Medicine

## 2021-08-25 DIAGNOSIS — K047 Periapical abscess without sinus: Secondary | ICD-10-CM

## 2021-08-25 DIAGNOSIS — K0889 Other specified disorders of teeth and supporting structures: Secondary | ICD-10-CM | POA: Diagnosis present

## 2021-08-25 MED ORDER — CEFTRIAXONE SODIUM 1 G IJ SOLR
1.0000 g | Freq: Once | INTRAMUSCULAR | Status: AC
  Administered 2021-08-25: 1 g via INTRAMUSCULAR
  Filled 2021-08-25: qty 10

## 2021-08-25 MED ORDER — LIDOCAINE HCL (PF) 1 % IJ SOLN
5.0000 mL | Freq: Once | INTRAMUSCULAR | Status: AC
  Administered 2021-08-25: 5 mL
  Filled 2021-08-25: qty 5

## 2021-08-25 MED ORDER — AMOXICILLIN 875 MG PO TABS: 875.0000 mg | ORAL_TABLET | Freq: Two times a day (BID) | ORAL | 0 refills | Status: DC

## 2021-08-25 MED ORDER — IBUPROFEN 600 MG PO TABS: 600.0000 mg | ORAL_TABLET | Freq: Four times a day (QID) | ORAL | 0 refills | Status: DC | PRN

## 2021-08-25 NOTE — ED Notes (Signed)
See triage note  Presents with left side dental pain  states has some swelling to gumline  Sx' started about 1 week ago

## 2021-08-25 NOTE — ED Triage Notes (Signed)
Pt to ED for 10/10 dental pain to R lower back jaw since 1 week ago. Pt complains of throat swelling and pain to same side. Provider evaluated pt in triage, no bloodwork orders at this time.

## 2021-08-25 NOTE — ED Provider Notes (Signed)
[facility] Provider Note    Event Date/Time   First MD Initiated Contact with Patient 08/25/21 1339     (approximate)   History   Dental Pain   HPI  Bruce Benson is a 26 y.o. male   is here with complaint of dental pain for the past week but worse last evening.  Patient has not seen a dentist for this.  He denies any fever.  Patient states that he does not have a dentist in the area.      Physical Exam   Triage Vital Signs: ED Triage Vitals [08/25/21 1243]  Enc Vitals Group     BP 125/76     Pulse Rate 66     Resp 16     Temp 98.4 F (36.9 C)     Temp Source Oral     SpO2 99 %     Weight 145 lb (65.8 kg)     Height 6\' 3"  (1.905 m)     Head Circumference      Peak Flow      Pain Score 10     Pain Loc      Pain Edu?      Excl. in GC?     Most recent vital signs: Vitals:   08/25/21 1243  BP: 125/76  Pulse: 66  Resp: 16  Temp: 98.4 F (36.9 C)  SpO2: 99%     General: Awake, no distress.  CV:  Good peripheral perfusion.  Resp:  Normal effort.  Abd:  No distention.  Other:  On physical exam posterior lower gum area is moderately swollen but not intruding into the oral pharynx.  There is an open area that appears to have drained some pus.   ED Results / Procedures / Treatments   Labs (all labs ordered are listed, but only abnormal results are displayed) Labs Reviewed - No data to display      PROCEDURES:  Critical Care performed:   Procedures   MEDICATIONS ORDERED IN ED: Medications  cefTRIAXone (ROCEPHIN) injection 1 g (1 g Intramuscular Given 08/25/21 1506)  lidocaine (PF) (XYLOCAINE) 1 % injection 5 mL (5 mLs Infiltration Given 08/25/21 1505)     IMPRESSION / MDM / ASSESSMENT AND PLAN / ED COURSE  I reviewed the triage vital signs and the nursing notes.   Differential diagnosis includes, but is not limited to, dental abscess, dental pain.  26 year old male presents to the ED with complaint of dental  pain that has been going on for approximately 1 week but worse since last evening.  On exam there is gum swelling with an open area which currently is not draining any pus.  No marked cervical lymphadenopathy.  Patient opted for an injection of antibiotics to keep from having to swallow pills.  Rocephin 1 g IM was ordered and patient will follow up with amoxicillin 875 for another 7 days.  He is to follow-up with one of the dentist listed on his discharge papers.      Patient's presentation is most consistent with acute, uncomplicated illness.  FINAL CLINICAL IMPRESSION(S) / ED DIAGNOSES   Final diagnoses:  Dental abscess     Rx / DC Orders   ED Discharge Orders          Ordered    amoxicillin (AMOXIL) 875 MG tablet  2 times daily        08/25/21 1517    ibuprofen (ADVIL) 600 MG tablet  Every 6 hours PRN  08/25/21 1517             Note:  This document was prepared using Dragon voice recognition software and may include unintentional dictation errors.   Tommi Rumps, PA-C 08/25/21 1520    Sharyn Creamer, MD 08/25/21 2036

## 2021-08-25 NOTE — ED Provider Triage Note (Signed)
Emergency Medicine Provider Triage Evaluation Note  Bruce Benson , a 26 y.o. male  was evaluated in triage.  Pt complains of dental pain x 1 week. No fever. Facial swelling for the past couple of days.  Physical Exam  There were no vitals taken for this visit. Gen:   Awake, no distress   Resp:  Normal effort  MSK:   Moves extremities without difficulty  Other:    Medical Decision Making  Medically screening exam initiated at 12:42 PM.  Appropriate orders placed.  Bruce Benson was informed that the remainder of the evaluation will be completed by another provider, this initial triage assessment does not replace that evaluation, and the importance of remaining in the ED until their evaluation is complete.    [name], FNP 08/25/21 1242

## 2021-08-25 NOTE — Discharge Instructions (Addendum)
Call make an appointment with one of the dentist listed on your dental clinic list.  Also note that the clinic and [facility] and [place] also have walk-in hours.  Begin taking the amoxicillin that was sent to the pharmacy along with the ibuprofen every 6 hours with food.  OPTIONS FOR DENTAL FOLLOW UP CARE  [place] [facility] - Local Safety Net Dental Clinics [email or web address]   [facility] [phone])  [facility] [phone])  [place] [phone] ext [phone])  [facility] [phone])  [facility] [phone]) This clinic caters to the indigent population and is on a lottery system. Location: [employer] [facility], [employer], [address] [place] Clinic Hours: Wednesdays from 6pm - 9pm, patients seen by a lottery system. For dates, call or go to [email or web address] Services: Cleanings, fillings and simple extractions. Payment Options: DENTAL WORK IS FREE OF CHARGE. Bring proof of income or support. Best way to get seen: Arrive at 5:15 pm - this is a lottery, NOT first come/first serve, so arriving earlier will not increase your chances of being seen.     [facility] [phone] Select option 1 for emergencies   Location: [facility], [place], [address], [place] Clinic Hours: No walk-ins accepted - call the day before to schedule an appointment. Check in times are 9:30 am and 1:30 pm. Services: Simple extractions, temporary fillings, pulpectomy/pulp debridement, uncomplicated abscess drainage. Payment Options: PAYMENT IS DUE AT THE TIME OF SERVICE.  Fee is usually $100-200, additional surgical procedures (e.g. abscess drainage) may be extra. Cash, checks, Visa/MasterCard accepted.  Can file Medicaid if patient is covered for dental -  patient should call case worker to check. No discount for [facility] patients. Best way to get seen: MUST call the day before and get onto the schedule. Can usually be seen the next 1-2 days. No walk-ins accepted.     [facility] [phone]   Location: [facility], [address], [place] Clinic Hours: M, W, Th, F 8am or 1:30pm, Tues 9a or 1:30 - first come/first served. Services: Simple extractions, temporary fillings, uncomplicated abscess drainage.  You do not need to be an [facility] resident. Payment Options: PAYMENT IS DUE AT THE TIME OF SERVICE. Dental insurance, otherwise sliding scale - bring proof of income or support. Depending on income and treatment needed, cost is usually $50-200. Best way to get seen: Arrive early as it is first come/first served.     [facility] [phone]   Location: [address] Clinic Hours: Mon-Thu 8a-5p Services: Most basic dental services including extractions and fillings. Payment Options: PAYMENT IS DUE AT THE TIME OF SERVICE. Sliding scale, up to 50% off - bring proof if income or support. Medicaid with dental option accepted. Best way to get seen: Call to schedule an appointment, can usually be seen within 2 weeks OR they will try to see walk-ins - show up at 8a or 2p (you may have to wait).     [facility] [phone] [place] RESIDENTS ONLY   Location: [facility], [address] [place], [place] [address] Clinic Hours: By appointment only. Monday - Thursday 8am-5pm, Friday 8am-12pm Services: Cleanings, fillings, extractions. Payment Options: PAYMENT IS DUE AT THE TIME OF SERVICE. Cash, Visa or MasterCard. Sliding scale - $30 minimum per service. Best way to get seen:  Come in to office, complete packet and make an appointment - need proof of income or support monies for each household  member and proof of [facility] residence. Usually takes about a month to get in.     [facility] [phone]   Location: [address] [place] Clinic Hours: Walk-in Urgent Care Dental Services are offered Monday-Friday mornings only. The numbers of emergencies accepted daily is limited to the number of providers available. Maximum 15 - Mondays, Wednesdays & Thursdays Maximum 10 - Tuesdays & Fridays Services: You do not need to be a [facility] resident to be seen for a dental emergency. Emergencies are defined as pain, swelling, abnormal bleeding, or dental trauma. Walkins will receive x-rays if needed. NOTE: Dental cleaning is not an emergency. Payment Options: PAYMENT IS DUE AT THE TIME OF SERVICE. Minimum co-pay is $40.00 for uninsured patients. Minimum co-pay is $3.00 for Medicaid with dental coverage. Dental Insurance is accepted and must be presented at time of visit. Medicare does not cover dental. Forms of payment: Cash, credit card, checks. Best way to get seen: If not previously registered with the clinic, walk-in dental registration begins at 7:15 am and is on a first come/first serve basis. If previously registered with the clinic, call to make an appointment.     [facility] [place] ONLY   Location: [address], [place], [place] Clinic Hours: Mon-Thu 10a-2p Services: Extractions only! Payment Options: FREE (donations accepted) - bring proof of income or support Best way to get seen: Call and schedule an appointment OR come at 8am on the 1st Monday of every month (except for holidays) when it is first come/first served.     [facility] [phone]   Location: [place], [place] Clinic Hours: Friday mornings Services, Payment Options, Best way to get seen: Call for info

## 2022-02-20 ENCOUNTER — Encounter: Payer: Self-pay | Admitting: Family Medicine

## 2022-02-20 ENCOUNTER — Ambulatory Visit: Payer: Medicaid Other | Admitting: Family Medicine

## 2022-02-20 DIAGNOSIS — Z113 Encounter for screening for infections with a predominantly sexual mode of transmission: Secondary | ICD-10-CM | POA: Diagnosis not present

## 2022-02-20 DIAGNOSIS — N341 Nonspecific urethritis: Secondary | ICD-10-CM

## 2022-02-20 LAB — HM HEPATITIS C SCREENING LAB: HM Hepatitis Screen: NEGATIVE

## 2022-02-20 LAB — HM HIV SCREENING LAB: HM HIV Screening: NEGATIVE

## 2022-02-20 LAB — HEPATITIS B SURFACE ANTIGEN: Hepatitis B Surface Ag: NONREACTIVE

## 2022-02-20 MED ORDER — DOXYCYCLINE HYCLATE 100 MG PO TABS: 100.0000 mg | ORAL_TABLET | Freq: Two times a day (BID) | ORAL | 0 refills | Status: AC

## 2022-02-20 NOTE — Progress Notes (Signed)
Pt here for STD screening.  Gram stain results reviewed and medication dispensed.  The patient was dispensed Doxycycline 100 mg #14 today. I provided counseling today regarding the medication. We discussed the medication, the side effects and when to call clinic. Patient given the opportunity to ask questions. Questions answered.  Condoms declined.  Windle Guard, RN

## 2022-02-20 NOTE — Progress Notes (Signed)
[facility] Department STI clinic/screening visit  Subjective:  Bruce Benson is a 27 y.o. male being seen today for an STI screening visit. The patient reports they do have symptoms.    Patient has the following medical conditions:  There are no problems to display for this patient.    Chief Complaint  Patient presents with   SEXUALLY TRANSMITTED DISEASE    Screening- patient complaining of discharge     HPI  Patient reports a 1 week hx of discharge from his penis.   Last HIV test per patient/review of record was  Lab Results  Component Value Date   HMHIVSCREEN Negative - Validated 11/26/2018    Lab Results  Component Value Date   HIV Non Reactive 11/18/2017    Does the patient or their partner desires a pregnancy in the next year? No  Screening for MPX risk: Does the patient have an unexplained rash? No Is the patient MSM? No Does the patient endorse multiple sex partners or anonymous sex partners? No Did the patient have close or sexual contact with a person diagnosed with MPX? No Has the patient traveled outside the [place] where MPX is endemic? No Is there a high clinical suspicion for MPX-- evidenced by one of the following No  -Unlikely to be chickenpox  -Lymphadenopathy  -Rash that present in same phase of evolution on any given body part   See flowsheet for further details and programmatic requirements.   Immunization History  Administered Date(s) Administered   Hepatitis A, Ped/Adol-2 Dose 08/27/2006   Hepatitis B, PED/ADOLESCENT 04/07/1993, 03/06/1994, 07/26/1994   MMR 03/12/1995, 07/31/1999   Tdap 08/27/2006   Varicella 03/12/1995, 08/27/2006     The following portions of the patient's history were reviewed and updated as appropriate: allergies, current medications, past medical history, past social history, past surgical history and problem list.  Objective:  There were no vitals filed for this visit.  Physical Exam Constitutional:       Appearance: Normal appearance.  HENT:     Head: Normocephalic and atraumatic.     Comments: No nits or hair loss    Mouth/Throat:     Mouth: Mucous membranes are moist. No oral lesions.     Pharynx: Oropharynx is clear. No oropharyngeal exudate or posterior oropharyngeal erythema.  Eyes:     General:        Right eye: No discharge.        Left eye: No discharge.     Conjunctiva/sclera:     Right eye: Right conjunctiva is not injected. No exudate.    Left eye: Left conjunctiva is not injected. No exudate. Pulmonary:     Effort: Pulmonary effort is normal.  Abdominal:     General: Abdomen is flat.     Palpations: Abdomen is soft. There is no hepatomegaly or mass.     Tenderness: There is no abdominal tenderness. There is no rebound.     Hernia: There is no hernia in the left inguinal area or right inguinal area.  Genitourinary:    Pubic Area: No rash or pubic lice (no nits).      Penis: Normal. No tenderness, discharge, swelling or lesions.      Testes: Normal.     Epididymis:     Right: Normal. No mass or tenderness.     Left: Normal. No mass or tenderness.     Rectum: Normal. No tenderness (no lesions or discharge).     Comments: Penile Discharge Amount: none Color:  none Lymphadenopathy:     Head:     Right side of head: No preauricular or posterior auricular adenopathy.     Left side of head: No preauricular or posterior auricular adenopathy.     Cervical: No cervical adenopathy.     Upper Body:     Right upper body: No supraclavicular, axillary or epitrochlear adenopathy.     Left upper body: No supraclavicular, axillary or epitrochlear adenopathy.     Lower Body: No right inguinal adenopathy. No left inguinal adenopathy.  Skin:    General: Skin is warm and dry.     Findings: No lesion or rash.  Neurological:     Mental Status: He is alert and oriented to person, place, and time.       Assessment and Plan:  IVIE MAESE is a 27 y.o. male presenting to the  [facility] Department for STI screening  1. Screening for venereal disease  - Gram stain - Chlamydia/GC NAA, Confirmation - Syphilis Serology, [facility] - HBV Antigen/Antibody [facility] - HIV/HCV [place] [facility]   Patient does have STI symptoms Patient accepted all screenings including   Patient meets criteria for HepB screening? Yes. Ordered? yes Patient meets criteria for HepC screening? Yes. Ordered? yes Recommended condom use with all sex Discussed importance of condom use for STI prevent  Treat gram stain per standing order Discussed time line for State Lab results and that patient will be called with positive results and encouraged patient to call if he had not heard in 2 weeks Recommended returning for continued or worsening symptoms.   Return if symptoms worsen or fail to improve.  No future appointments.  Sharlet Salina, [place]

## 2022-02-20 NOTE — Addendum Note (Signed)
Addended by: Windle Guard on: 02/20/2022 04:45 PM   Modules accepted: Orders

## 2022-02-21 LAB — GRAM STAIN

## 2022-02-23 LAB — CHLAMYDIA/GC NAA, CONFIRMATION
Chlamydia trachomatis, NAA: NEGATIVE
Neisseria gonorrhoeae, NAA: NEGATIVE
